# Patient Record
Sex: Female | Born: 1935 | Race: White | Hispanic: No | Marital: Married | Smoking: Never smoker
Health system: Southern US, Community
[De-identification: ages and names within clinical notes are randomized; demographics above are authoritative.]

## PROBLEM LIST (undated history)

## (undated) DIAGNOSIS — I10 Essential (primary) hypertension: Secondary | ICD-10-CM

## (undated) DIAGNOSIS — R55 Syncope and collapse: Secondary | ICD-10-CM

## (undated) DIAGNOSIS — M8580 Other specified disorders of bone density and structure, unspecified site: Secondary | ICD-10-CM

## (undated) DIAGNOSIS — E039 Hypothyroidism, unspecified: Secondary | ICD-10-CM

## (undated) DIAGNOSIS — J069 Acute upper respiratory infection, unspecified: Secondary | ICD-10-CM

## (undated) DIAGNOSIS — E7800 Pure hypercholesterolemia, unspecified: Secondary | ICD-10-CM

## (undated) DIAGNOSIS — M1990 Unspecified osteoarthritis, unspecified site: Secondary | ICD-10-CM

## (undated) DIAGNOSIS — Z973 Presence of spectacles and contact lenses: Secondary | ICD-10-CM

## (undated) HISTORY — PX: OTHER SURGICAL HISTORY: SHX169

## (undated) HISTORY — PX: TUBAL LIGATION: SHX77

## (undated) HISTORY — PX: TONSILLECTOMY: SUR1361

## (undated) HISTORY — PX: DILATION AND CURETTAGE OF UTERUS: SHX78

## (undated) HISTORY — PX: EYE SURGERY: SHX253

## (undated) HISTORY — PX: ABDOMINAL HYSTERECTOMY: SHX81

## (undated) HISTORY — DX: Syncope and collapse: R55

---

## 1986-03-21 HISTORY — PX: OTHER SURGICAL HISTORY: SHX169

## 1998-01-27 ENCOUNTER — Other Ambulatory Visit: Admission: RE | Admit: 1998-01-27 | Discharge: 1998-01-27 | Payer: Self-pay | Admitting: Cardiology

## 1999-02-10 ENCOUNTER — Encounter: Admission: RE | Admit: 1999-02-10 | Discharge: 1999-02-10 | Payer: Self-pay | Admitting: Cardiology

## 1999-02-10 ENCOUNTER — Encounter: Payer: Self-pay | Admitting: Cardiology

## 2000-02-14 ENCOUNTER — Encounter: Admission: RE | Admit: 2000-02-14 | Discharge: 2000-02-14 | Payer: Self-pay | Admitting: Internal Medicine

## 2000-02-14 ENCOUNTER — Encounter: Payer: Self-pay | Admitting: Internal Medicine

## 2000-04-13 ENCOUNTER — Other Ambulatory Visit: Admission: RE | Admit: 2000-04-13 | Discharge: 2000-04-13 | Payer: Self-pay | Admitting: Internal Medicine

## 2000-12-18 ENCOUNTER — Ambulatory Visit (HOSPITAL_COMMUNITY): Admission: RE | Admit: 2000-12-18 | Discharge: 2000-12-18 | Payer: Self-pay | Admitting: Gastroenterology

## 2000-12-18 ENCOUNTER — Encounter (INDEPENDENT_AMBULATORY_CARE_PROVIDER_SITE_OTHER): Payer: Self-pay | Admitting: *Deleted

## 2001-02-20 ENCOUNTER — Encounter: Admission: RE | Admit: 2001-02-20 | Discharge: 2001-02-20 | Payer: Self-pay | Admitting: *Deleted

## 2001-02-20 ENCOUNTER — Encounter: Payer: Self-pay | Admitting: *Deleted

## 2001-02-28 ENCOUNTER — Encounter: Payer: Self-pay | Admitting: *Deleted

## 2001-02-28 ENCOUNTER — Encounter: Admission: RE | Admit: 2001-02-28 | Discharge: 2001-02-28 | Payer: Self-pay | Admitting: *Deleted

## 2001-05-02 ENCOUNTER — Other Ambulatory Visit: Admission: RE | Admit: 2001-05-02 | Discharge: 2001-05-02 | Payer: Self-pay | Admitting: *Deleted

## 2002-03-04 ENCOUNTER — Encounter: Payer: Self-pay | Admitting: Obstetrics and Gynecology

## 2002-03-04 ENCOUNTER — Encounter: Admission: RE | Admit: 2002-03-04 | Discharge: 2002-03-04 | Payer: Self-pay | Admitting: Obstetrics and Gynecology

## 2002-06-05 ENCOUNTER — Other Ambulatory Visit: Admission: RE | Admit: 2002-06-05 | Discharge: 2002-06-05 | Payer: Self-pay | Admitting: Obstetrics & Gynecology

## 2003-03-31 ENCOUNTER — Encounter: Admission: RE | Admit: 2003-03-31 | Discharge: 2003-03-31 | Payer: Self-pay | Admitting: Internal Medicine

## 2003-10-29 ENCOUNTER — Emergency Department (HOSPITAL_COMMUNITY): Admission: EM | Admit: 2003-10-29 | Discharge: 2003-10-29 | Payer: Self-pay | Admitting: Emergency Medicine

## 2004-02-24 ENCOUNTER — Ambulatory Visit (HOSPITAL_COMMUNITY): Admission: RE | Admit: 2004-02-24 | Discharge: 2004-02-24 | Payer: Self-pay | Admitting: Gastroenterology

## 2004-04-22 ENCOUNTER — Encounter: Admission: RE | Admit: 2004-04-22 | Discharge: 2004-04-22 | Payer: Self-pay | Admitting: Internal Medicine

## 2005-05-09 ENCOUNTER — Encounter: Admission: RE | Admit: 2005-05-09 | Discharge: 2005-05-09 | Payer: Self-pay | Admitting: Internal Medicine

## 2006-03-02 ENCOUNTER — Encounter: Admission: RE | Admit: 2006-03-02 | Discharge: 2006-03-02 | Payer: Self-pay | Admitting: Family Medicine

## 2006-03-09 ENCOUNTER — Encounter: Admission: RE | Admit: 2006-03-09 | Discharge: 2006-03-09 | Payer: Self-pay | Admitting: Orthopedic Surgery

## 2006-03-21 HISTORY — PX: HERNIA REPAIR: SHX51

## 2006-04-03 ENCOUNTER — Encounter: Admission: RE | Admit: 2006-04-03 | Discharge: 2006-04-03 | Payer: Self-pay | Admitting: Orthopedic Surgery

## 2006-04-24 ENCOUNTER — Encounter: Admission: RE | Admit: 2006-04-24 | Discharge: 2006-04-24 | Payer: Self-pay | Admitting: Specialist

## 2006-05-08 ENCOUNTER — Encounter: Admission: RE | Admit: 2006-05-08 | Discharge: 2006-05-08 | Payer: Self-pay | Admitting: Orthopedic Surgery

## 2006-05-11 ENCOUNTER — Encounter: Admission: RE | Admit: 2006-05-11 | Discharge: 2006-05-11 | Payer: Self-pay | Admitting: Internal Medicine

## 2006-10-27 ENCOUNTER — Encounter: Admission: RE | Admit: 2006-10-27 | Discharge: 2006-10-27 | Payer: Self-pay | Admitting: Rheumatology

## 2007-02-26 ENCOUNTER — Ambulatory Visit (HOSPITAL_COMMUNITY): Admission: RE | Admit: 2007-02-26 | Discharge: 2007-02-26 | Payer: Self-pay | Admitting: Surgery

## 2007-03-22 HISTORY — PX: OTHER SURGICAL HISTORY: SHX169

## 2007-03-22 HISTORY — PX: BACK SURGERY: SHX140

## 2007-05-14 ENCOUNTER — Encounter: Admission: RE | Admit: 2007-05-14 | Discharge: 2007-05-14 | Payer: Self-pay | Admitting: Internal Medicine

## 2007-05-17 ENCOUNTER — Encounter: Admission: RE | Admit: 2007-05-17 | Discharge: 2007-05-17 | Payer: Self-pay | Admitting: Specialist

## 2007-06-14 ENCOUNTER — Observation Stay (HOSPITAL_COMMUNITY): Admission: RE | Admit: 2007-06-14 | Discharge: 2007-06-15 | Payer: Self-pay | Admitting: Neurological Surgery

## 2007-07-21 ENCOUNTER — Encounter: Admission: RE | Admit: 2007-07-21 | Discharge: 2007-07-21 | Payer: Self-pay | Admitting: Neurological Surgery

## 2007-08-08 ENCOUNTER — Encounter: Admission: RE | Admit: 2007-08-08 | Discharge: 2007-08-08 | Payer: Self-pay | Admitting: Orthopedic Surgery

## 2007-09-26 ENCOUNTER — Inpatient Hospital Stay (HOSPITAL_COMMUNITY): Admission: RE | Admit: 2007-09-26 | Discharge: 2007-09-28 | Payer: Self-pay | Admitting: Orthopedic Surgery

## 2008-02-09 ENCOUNTER — Encounter: Admission: RE | Admit: 2008-02-09 | Discharge: 2008-02-09 | Payer: Self-pay | Admitting: Orthopedic Surgery

## 2008-05-14 ENCOUNTER — Encounter: Admission: RE | Admit: 2008-05-14 | Discharge: 2008-05-14 | Payer: Self-pay | Admitting: Internal Medicine

## 2008-07-10 ENCOUNTER — Emergency Department (HOSPITAL_COMMUNITY): Admission: EM | Admit: 2008-07-10 | Discharge: 2008-07-10 | Payer: Self-pay | Admitting: Emergency Medicine

## 2009-06-08 ENCOUNTER — Encounter: Admission: RE | Admit: 2009-06-08 | Discharge: 2009-06-08 | Payer: Self-pay | Admitting: Internal Medicine

## 2010-05-24 ENCOUNTER — Other Ambulatory Visit: Payer: Self-pay | Admitting: Internal Medicine

## 2010-05-24 DIAGNOSIS — Z1231 Encounter for screening mammogram for malignant neoplasm of breast: Secondary | ICD-10-CM

## 2010-06-10 ENCOUNTER — Ambulatory Visit
Admission: RE | Admit: 2010-06-10 | Discharge: 2010-06-10 | Disposition: A | Discharge status: Home / Self Care | Payer: Medicare (Managed Care) | Source: Ambulatory Visit | Attending: Internal Medicine | Admitting: Internal Medicine

## 2010-06-10 DIAGNOSIS — Z1231 Encounter for screening mammogram for malignant neoplasm of breast: Secondary | ICD-10-CM

## 2010-08-03 NOTE — Op Note (Signed)
NAME:  Shelley Cook, Shelley Cook               ACCOUNT NO.:  [id number]   MEDICAL RECORD NO.:  [id number]          PATIENT TYPE:  INP   LOCATION:  0005                         FACILITY:  [facility]   PHYSICIAN:  Madlyn Frankel. Charlann Boxer, M.D.  DATE OF BIRTH:  [birth year]   DATE OF PROCEDURE:  09/26/2007  DATE OF DISCHARGE:                               OPERATIVE REPORT   PREOPERATIVE DIAGNOSIS:  Left hip avascular necrosis with a history of  previous percutaneous cannulated screw fixation for femoral neck  fracture.   POSTOPERATIVE DIAGNOSIS:  Left hip avascular necrosis with a history of  previous percutaneous cannulated screw fixation for femoral neck  fracture.   PROCEDURE:  Conversion of previous left hip surgery to a left total hip  replacement with removal of hardware.   SURGEON:  Madlyn Frankel. Charlann Boxer, M.D.   ASSISTANT:  Yetta Glassman. Mann, PA   COMPONENTS USED:  DePuy hip system 52 Pinnacle cup, 36 +4 marathon  liner, a 5 standard stem with a +5 ball.   ANESTHESIA:  General.   BLOOD LOSS:  250 mL.   DRAINS:  Times one.   COMPLICATIONS:  None.   INDICATION OF THE PROCEDURE:  Shelley Cook is a 77 year old female who  presented on evaluation of persistent left hip pain.  She had had  previous problems with her back including decompressive surgery without  significant relief.  Radiographs and a workup of persistent discomfort  revealed avascular changes.  She was sent for consultation.  I reviewed  the findings.  She had evidence of advanced collapse with previous  hardware placed.  Given these findings we reviewed her options.  She had  had a cortisone injection was confirmed relief of her pain but it was  nonsustained.  She wished to proceed with surgery.  The risks and  benefits of surgery were discussed.  Consent was obtained including the  review of risks of infection, DVT, component failure, dislocation, need  for revision surgery.   PROCEDURE IN DETAIL:  The patient was brought to the  operative theater.  Once adequate anesthesia preoperative antibiotics, clindamycin due to  penicillin allergy administered the patient was positioned in the right  lateral decubitus position with the identified left hip up.  Left lower  extremity was prescrubbed and prepped and draped in sterile fashion.  The patient's old incision was a very long incision more anterior than  what I would need to utilize I thus created a single incision  posteriorly incorporating the lateral aspect of the femur so could get  the hardware out.  Attention was first directed at the hardware removal.  I incised the iliotibial band and was able to palpate the screws.  Using  the hand screwdriver I was able to remove the screws part way then used  power screwdrivers.  There were removed without difficulty or  complications.  Following this removal I irrigated this lateral wound  and reapproximated the wound edges with a #1 Vicryl.   At this point we attended to the hip.  The iliotibial band and gluteal  fascia was then incised posteriorly.  With the  hip internally rotated  the short external rotators were identified and taken down to the  posterior capsule.  A capsulotomy was created, hip was dislocated.  A  neck osteotomy was made at this point based off anatomic landmarks  utilizing a trial neck and head size.  The patient's head was noted to  have severe avascular changes with obvious collapse and fragmentation of  the cartilage surfaces with subchondral collapse and big fragment.   Attention was first directed to the femur.  I used the box osteotome to  assure laterality.  I then started an initiating reamer and then a hand  reamer.  I then irrigated the canal to prevent fat emboli.  We began  broaching with size 1.  I carried it up to a size 5 which sat at the  level of my neck cut medially.  At this point I packed the femur and  acetabulum.  Femoral anteversion was set at 20-25 degrees.  Once I had   placed retractors around the acetabulum the labrum was removed.  I began  reaming with a 44 reamer up to a 51 reamer with good bony bed  preparation down the medial wall with good coverage.  I thus impacted 52  Pinnacle cup set anatomically within the ischium of the anterior wall  and with some superior lateral cup exposed indicating good anteversion.  I checked with the hip guide and it was about 35-40 degrees of  abduction, 20 degrees of forward flexion.  I placed a single cancellous  screw due to the initial fit with good purchase.  I then placed the  final 36 +4 Marathon liner choosing this liner from Marathon due to her  age and ball size increasing instability.   We attended to now the femur.  We placed the 5 broach and the standard  neck.  I used a first a +1.5 and then a 5 ball.  I did not think this  effected the leg lengths.  It helped a little bit with tension on the  abductors.  The hip range of motion was excellent with no evidence of  any subluxation or impingement.  I removed the trial broach and placed a  size 5 standard stem.  This was impacted at the level where the broach  was and then a 36 +5 ball was impacted onto the dry trunnion.  We  irrigated the hip throughout the case and again at this point.  I  reapproximated the posterior capsule with superior leaflet using #1  Ethibond.  I placed a medium Hemovac drain deep.  I then reapproximated  the iliotibial band and gluteal fascia with #1 running Vicryl.  I did  release some of the tensor fascia of the iliotibial band anteriorly to  relax some of the tension on lateral structures.  This was due to some  scarring experienced from removal of the hardware.  Following this the  remainder of the wound was closed with 2-0 Vicryl and a running 4-0  Monocryl.  The hip was cleaned, dried and dressed sterilely with Steri-  Strips and a sterile Mepilex dressing.  She was then brought to the  recovery room and extubated in stable  condition tolerating the procedure  well.      Madlyn Frankel Charlann Boxer, M.D.  Electronically Signed     MDO/MEDQ  D:  09/26/2007  T:  09/26/2007  Job:  [id number]   cc:   Tia Alert, MD  Fax: [phone]

## 2010-08-03 NOTE — Op Note (Signed)
NAME:  Shelley Cook, Shelley Cook               ACCOUNT NO.:  [id number]   MEDICAL RECORD NO.:  [id number]          PATIENT TYPE:  OBV   LOCATION:  3536                         FACILITY:  [facility]   PHYSICIAN:  Tia Alert, MD     DATE OF BIRTH:  Jun 15, 1929   DATE OF PROCEDURE:  06/14/2007  DATE OF DISCHARGE:                               OPERATIVE REPORT   PREOPERATIVE DIAGNOSIS:  Lumbar spinal stenosis of L4-5, L5-S1 on the  left with lumbar disk herniation of L4-5 on the left with left groin and  leg pain.   POSTOPERATIVE DIAGNOSIS:  Lumbar spinal stenosis of L4-5, L5-S1 on the  left with lumbar disk herniation of L4-5 on the left with left groin and  leg pain.   PROCEDURE:  1. Decompressive lumbar hemilaminectomy, medial facetectomy,      foraminotomy of L4-5 on the left for L5 nerve root decompression      followed by microdiskectomy of L4-5 on the left utilizing      microscopic dissection.  2. Lumbar hemilaminectomy, medial facetectomy, foraminotomy of L5-S1      on the left for decompression of left S1 nerve root utilizing      microscopic dissection.   SURGEON:  Tia Alert, M.D.   ANESTHESIA:  General endotracheal.   COMPLICATIONS:  None apparent.   INDICATIONS FOR PROCEDURE:  Shelley Cook is a 77 year old female who was  referred with left leg pain in a strict L5 distribution, but she also  had left groin pain.  She had MRI which showed multilevel  spondylosis which showed significant lateral recess stenosis at L4-5 on  the left with the disk herniation at L4-5 on the left with also lateral  recess stenosis at L5-S1 on the left side.  I recommended a two-level  decompressive hemilaminectomy, medial facetectomy, and foraminotomy with  possible diskectomy at L4-5.  She understood the risks, benefits, and  expected outcome and wished to proceed.   DESCRIPTION OF PROCEDURE:  The patient was taken to the operating room,  and after induction of adequate general endotracheal  anesthesia, she was  rolled into the prone position on the Wilson frame and all pressure  points were padded.  The lumbar region was prepped with DuraPrep and  then draped in the usual sterile fashion.   5 mL of local anesthesia was injected, and a small dorsal midline  incision was made and carried down to the lumbosacral fascia.  The  fascia was opened, and the paraspinous musculature was taken down in a  subperiosteal fashion to expose L4-5 and L5-S1 on the left.  Intraoperative x-ray confirmed my level, and then I used the Kerrison  punches to perform hemilaminectomies, medial facetectomies, and  foraminotomies at L4-5 and L5-S1 on the left side.  The lateral recess  was decompressed by undercutting the facets.  The yellow ligament was  removed.  We got well lateral to the dura.  I marched along the pedicle  at L5 along the L5 nerve root to assure adequate decompression of the L5  nerve root.  I then palpated with a coronary  dilator.  The nerve root  was free.  I felt no compression there.  I decompressed the left S1  nerve root by marching along the pedicle and decompressing the lateral  recess and palpated there with a coronary dilator.  I had excellent  decompression of the thecal sac and the L5 the S1 nerve roots.  I  retracted the nerve root medially at L5.  I found a calcified disk  herniation that was removed with an osteophyte remover.  I then  irrigated with saline solution containing bacitracin, dried all bleeding  points, inspected the nerve roots once again, lined the dura with  Gelfoam, and then closed the muscle with 0 Vicryl, closing the  subcutaneous and subcuticular tissue with 2-0 and 3-0 Vicryl, and closed  the skin with Benzoin and Steri-Strips.  The drapes were removed.  A  sterile dressing was applied.   The patient was awakened from general anesthesia and transferred to the  recovery room in stable condition.  At the end of the procedure, all  sponge, needle,  and instrument counts were correct.      Tia Alert, MD  Electronically Signed     DSJ/MEDQ  D:  06/14/2007  T:  06/15/2007  Job:  [id number]

## 2010-08-03 NOTE — Op Note (Signed)
NAME:  Shelley Cook, Shelley Cook               ACCOUNT NO.:  [id number]   MEDICAL RECORD NO.:  [id number]          PATIENT TYPE:  AMB   LOCATION:  DAY                          FACILITY:  [facility]   PHYSICIAN:  Wilmon Arms. Corliss Skains, M.D. DATE OF BIRTH:  [birth year]   DATE OF PROCEDURE:  02/26/2007  DATE OF DISCHARGE:                               OPERATIVE REPORT   PREOPERATIVE DIAGNOSIS:  Left inguinal hernia.   POSTOPERATIVE DIAGNOSIS:  Left inguinal hernia.   PROCEDURE PERFORMED:  Left inguinal hernia repair with mesh.   SURGEON:  Wilmon Arms. Corliss Skains, M.D., FACS   ANESTHESIA:  General.   INDICATIONS:  The patient is a 77 year old female who presents with  several months of an enlarging bulge in her left groin.  This has caused  some discomfort.  She was evaluated and was felt to have a left inguinal  hernia.  We recommended elective repair.   DESCRIPTION OF PROCEDURE:  The patient is brought to the operating room,  placed in the supine position on the operating room table.  After an  adequate level of general anesthesia was obtained, the patient's left  groin was shaved, prepped with Betadine and draped in sterile fashion.  A time-out was taken to assure the proper patient and proper procedure.  The area above her left inguinal ligament was infiltrated with 0.25%  Marcaine.  We made an oblique incision above the inguinal ligament.  Dissection was carried down through the subcutaneous tissues to the  external oblique fascia.  The Weitlaner retractor was inserted.  We  opened the external oblique fascia along the direction of its fibers.  We were able to dissect around the round ligament. There was a moderate-  sized indirect hernia sac adherent to the round ligament.  The round  ligament was divided between clamps distally.  It was then ligated with  #0 silk ties. The round ligament and hernia sac were then reduced up  through the internal ring. The internal ring was then closed with a  running 2-0  Vicryl suture. 2-0 Vicryl was then used to secure an oval  shaped piece of Ultrapro mesh beginning at the pubic tubercle.  It was  attached to the shelving edge inferiorly and the internal oblique  superiorly. The lateral end was tucked underneath the external oblique  fascia.  The external oblique fascia was reapproximated with 2-0 Vicryl.  3-0 Vicryl was used to close the subcutaneous tissues and 4-0 Monocryl  was used to close the skin.  Steri-Strips and clean dressings were  applied.  The patient was extubated and brought to the recovery room in  stable condition.      Wilmon Arms. Tsuei, M.D.  Electronically Signed     MKT/MEDQ  D:  02/26/2007  T:  02/26/2007  Job:  [id number]

## 2010-08-06 NOTE — H&P (Signed)
NAME:  Shelley Cook, Shelley Cook               ACCOUNT NO.:  [id number]   MEDICAL RECORD NO.:  [id number]         PATIENT TYPE:  LINP   LOCATION:                               FACILITY:  [facility]   PHYSICIAN:  Madlyn Frankel. Charlann Boxer, M.D.  DATE OF BIRTH:  Oct 15, 1929   DATE OF ADMISSION:  09/26/2007  DATE OF DISCHARGE:                              HISTORY & PHYSICAL   Procedure will be a conversion left total hip replacement.   CHIEF COMPLAINT:  Left hip pain.   HISTORY OF PRESENT ILLNESS:  A 77 year old female with a history of left  hip pain secondary to avascular necrosis with a significant history of  recent lumbar surgery due to disk herniation.  Her left hip has been  refractory to all conservative treatment, also including intra-articular  injection.  She has been presurgically assessed by Dr. Felipa Eth for a left  total knee replacement.   PAST MEDICAL HISTORY:  1. Osteoarthritis.  2. Degenerative disk disease with disk herniation.  3. Hypertension.  4. Dyslipidemia.  5. Hypothyroidism.   PAST SURGICAL HISTORY:  1. Hysterectomy.  2. Heel surgery.  3. Hernia repair.  4. Lumbar diskectomy.   FAMILY HISTORY:  Heart disease, Alzheimer's, lung cancer.   SOCIAL HISTORY:  Married.  Primary caregiver will be her husband at home  after surgery.   ALLERGIES:  PENICILLIN, CEFTIN, AUGMENTIN, KEFLEX, ZITHROMAX, SUDAFED,  SULFA, LEVAQUIN, NITROFURANTOIN, CELEBREX, IBUPROFEN ALEVE.  States  aspirin; however, the patient takes Ecotrin with no complications.   MEDICATIONS:  1. Synthroid 88 mcg one daily.  2. Coreg 25 mg p.o. b.i.d.  3. Clonidine 0.1 mg p.o. daily.  4. Azor 5/40 mg one p.o. daily.  5. Zocor 20 mg p.o. daily.  6. Ecotrin 81 mg one p.o. daily.  7. Singulair 10 mg one p.o. daily.  8. Allegra 180 mg p.o. p.r.n. only.  9. Boniva once monthly on the 16th month 150 mg p.o.   REVIEW OF SYSTEMS:  None other than HPI.   PHYSICAL EXAMINATION:  Pulse 64, respirations 18, blood pressure  124/76.  GENERAL:  Awake, alert and oriented, well-developed, well-nourished, in  no acute distress.  NECK:  Supple.  No carotid bruits.  CHEST:  Lungs are clear to auscultation bilaterally.  HEART:  Regular rate and rhythm.  S1, S2 distinct.  ABDOMEN:  Soft, nontender, bowel sounds present.  GENITOURINARY:  Deferred.  EXTREMITIES:  The left hip has decreased range of motion with increased  pain with range of motion.  SKIN:  No cellulitis.  NEUROLOGIC:  Intact distal sensibilities.   Labs, EKG, chest x-ray:  All pending presurgical testing.   IMPRESSION:  Left hip avascular necrosis, status post previous  percutaneous screw fixation.   PLAN OF ACTION:  Conversion left total hip arthroplasty by surgeon Dr.  Durene Romans, September 26, 2007, at [facility].  The risks and  complications were discussed as well as postoperative rehab.   Postoperative medications were provided at time of history and physical  including Lovenox, Robaxin, iron, aspirin, Colace, MiraLax.  Pain  medicines will be provided at time of surgery.  ______________________________  Yetta Glassman [address], [place]      Madlyn Frankel. Charlann Boxer, M.D.  Electronically Signed    BLM/MEDQ  D:  09/10/2007  T:  09/10/2007  Job:  [id number]   cc:   Larina Earthly, M.D.  Fax: [phone]   Tia Alert, MD  Fax: [phone]

## 2010-08-06 NOTE — Discharge Summary (Signed)
NAME:  Shelley Cook, Shelley Cook               ACCOUNT NO.:  [id number]   MEDICAL RECORD NO.:  [id number]          PATIENT TYPE:  INP   LOCATION:  1604                         FACILITY:  [facility]   PHYSICIAN:  Madlyn Frankel. Charlann Boxer, M.D.  DATE OF BIRTH:  13-Oct-1929   DATE OF ADMISSION:  09/26/2007  DATE OF DISCHARGE:  09/28/2007                               DISCHARGE SUMMARY   ADMISSION DIAGNOSES:  1. Avascular necrosis.  2. Osteoarthritis.  3. Degenerative disk disease.  4. Hypertension.  5. Dyslipidemia.  6. Hypothyroidism.   DISCHARGE DIAGNOSES:  1. Avascular necrosis.  2. Osteoarthritis.  3. Degenerative disk disease.  4. Hypertension.  5. Dyslipidemia.  6. Hypothyroidism.   HISTORY OF PRESENT ILLNESS:  A 77 year old female with a history of left  hip pain secondary to avascular necrosis with significant history of  recent lumbar surgery due to disk herniation.  Refractory to all  conservative treatment.   CONSULTATION:  None.   PROCEDURE:  Left total hip arthroplasty by surgeon, Dr. Durene Romans.  Assistant, Dwyane Luo PA-C.   LABORATORY DATA:  CBC on admission hemoglobin 13.9, hematocrit 40.7,  platelets at 275; at time of discharge 8.8, 25.3 and platelets 149.  White cell differential no significant abnormalities.  Coagulation all  within normal limits.  Routine chemistry on admission with  sodium 135,  potassium 5.2, glucose 105 and creatinine 0.76; at time of discharge,  sodium 129, potassium 3.9, glucose 138, creatinine 0.55.  Kidney  function normal with calcium 8.2 at discharge.  UA showed hyaline casts,  small leukocyte esterase and negative nitrites.   No chest x-ray found on chart.   Previous EKG showed sinus bradycardia, borderline ECG.   HOSPITAL COURSE:  The patient underwent left total hip placement,  admitted to the orthopedic floor.  She made adequate progress during the  course of stay.  She remained hemodynamically stable, afebrile.  Dressing was changed on a  daily basis with no significant drainage from  the wound.  She remained neurovascularly intact with this left lower  extremity.  DVT prophylaxis started on day #1.  Not eventful with  ability to walk at least 20 feet with rolling walker prior to discharge  and all functional criteria met.   DISCHARGE DISPOSITION:  Discharged home with home health care PT.   DISCHARGE PHYSICAL THERAPY:  Weightbearing as tolerated with use of a  rolling walker.   DISCHARGE DIET:  Regular.   DISCHARGE WOUND CARE:  Keep dry.   DISCHARGE MEDICATIONS:  1. Lovenox 40 mg subcu q.24 h x11 days.  2. Robaxin 500 mg p.o. q.6 h.  3. Iron 325 mg one p.o. t.i.d. x2 weeks.  4. Aspirin 325 mg one p.o. daily after Lovenox completed x4 weeks.  5. Colace 100 mg p.o. b.i.d.  6. MiraLax 17 grams p.o. daily.  7. Vicodin 5/325 one to two p.o. q.4-6 h p.r.n. pain.  8. Ambien 5 mg one p.o. nightly.  9. Synthroid 88 mcg p.o. daily.  10.Coreg 25 mg one p.o. b.i.d.  11.Clonidine 0.1 mg one p.o. daily.  12.Azor 5/40 one p.o. nightly.  13.Zocor 20  mg one p.o. nightly.  14.Ecotrin.  15.Singulair 10 mg p.o. nightly.  16.Allegra 180 mg p.r.n.  17.Boniva 150 mg monthly on the 16th.   DISCHARGE FOLLOWUP:  Follow up with Dr. Charlann Boxer, phone number [phone] in 2  weeks.     ______________________________  Yetta Glassman [address], [place]      Madlyn Frankel. Charlann Boxer, M.D.  Electronically Signed    BLM/MEDQ  D:  11/05/2007  T:  11/05/2007  Job:  [id number]   cc:   Larina Earthly, M.D.  Fax: [phone]   Marikay Alar  [address]  [place]  [place] [address]

## 2010-08-06 NOTE — Procedures (Signed)
[place]  Patient:    EZMA, REHM Visit Number: [id number] MRN: [id number]          Service Type: END Location: ENDO Attending Physician:  Rich Brave Dictated by:   Florencia Reasons, M.D. Proc. Date: 12/18/00 Admit Date:  12/18/2000   CC:         Ravi R. Felipa Eth, M.D.   Procedure Report  PROCEDURE PERFORMED:  Colonoscopy with polypectomy.  ENDOSCOPIST:  Florencia Reasons, M.D.  INDICATIONS FOR PROCEDURE:  The patient is a 77 year old female who underwent a screening sigmoidoscopic evaluation in Dr. Lanier Clam office recently by Dr. Dossie Arbour, which disclosed a small fleshy polyp a 15 cm.  FINDINGS:  Polyp removed as described.  No additional lesions identified.  DESCRIPTION OF PROCEDURE:  The nature, purpose and risks of the procedure had been discussed with the patient, who provided written consent.  Sedation was fentanyl 75 mcg and Versed 7 mg IV without arrhythmias or desaturation.  The Olympus adjustable tension pediatric video colonoscope was advanced to the cecum with moderate difficulty due to looping.  The cecum was identified by visualization of the ileocecal valve, the absence of further lumen and very The quality of the prep was very good and it is felt that all areas were adequately seen.  It should be noted that in order to reach the cecum in this patient, to control looping we had to turn the patient into the supine and then right lateral decubitus positions and then back onto her back, but ultimately we were able to reach the cecum as described above.  The only abnormality encountered on this exam was the presence of a  5 mm fleshy polyp removed by snare technique at about 15 cm from the external inguinal opening.  The polyp was retrieved by suctioning through the scope. There was complete hemostasis and no evidence of excessive cautery. Retroflexion was unremarkable.  No large polyps, cancer, colitis,  vascular malformations were noted.  The patient tolerated the procedure well and there were no apparent complications.   IMPRESSION:  Rectal polyp removed by snare technique.  Otherwise normal exam to the cecum.  PLAN:  Await pathology on the polyp with further management to depend on histologic findings. y Dictated by:   Florencia Reasons, M.D. Attending Physician:  Rich Brave DD:  12/18/00 TD:  12/18/00 Job: [id number]

## 2010-08-06 NOTE — Op Note (Signed)
NAME:  Shelley Cook, Shelley Cook               ACCOUNT NO.:  [id number]   MEDICAL RECORD NO.:  [id number]          PATIENT TYPE:  AMB   LOCATION:  ENDO                         FACILITY:  [facility]   PHYSICIAN:  Bernette Redbird, M.D.   DATE OF BIRTH:  02/29/1930   DATE OF PROCEDURE:  02/24/2004  DATE OF DISCHARGE:                                 OPERATIVE REPORT   PROCEDURE:  Colonoscopy.   INDICATIONS FOR PROCEDURE:  Follow up of several colonic adenomas removed  several years ago.   FINDINGS:  Normal exam to the cecum.   PROCEDURE:  The nature, purpose, and risks of the procedure were familiar to  the patient who provided written consent.  Sedation was Fentanyl 60 mcg and  Versed 7.5 mg IV without arrhythmias or desaturations.  The Olympus adult  video colonoscope was used for this procedure and was much more easy to  advance than the pediatric adjustable tension scope that had been used  previously.  In fact, the adult scope was able to be advanced without any  external abdominal compression and we reached the cecum quite easily, taking  out loops as I went.  The cecum was identified by clear visualization of the  appendiceal orifice.  Pull back was then performed.  The quality of the prep  was excellent and it was felt that all areas were well seen.  This was a  normal examination.  No polyps, cancer, colitis, vascular malformations, or  diverticulosis were noted.  Retroflexion in the rectum and reinspection in  the rectum were unremarkable.  No biopsies were obtained.  The patient  tolerated the procedure well and there were no apparent complications.   IMPRESSION:  Prior history of colonic adenoma without worrisome findings on  current examination.   PLAN:  Follow up colonoscopy in five years.       RB/MEDQ  D:  02/24/2004  T:  02/24/2004  Job:  [id number]   cc:   Larina Earthly, M.D.  [address]  [place]  [place] [address]  Fax: [phone]

## 2010-12-13 LAB — DIFFERENTIAL
Basophils Absolute: 0
Eosinophils Relative: 4
Lymphocytes Relative: 10 — ABNORMAL LOW
Neutro Abs: 6.8
Neutrophils Relative %: 77

## 2010-12-13 LAB — BASIC METABOLIC PANEL
BUN: 10
Chloride: 93 — ABNORMAL LOW
GFR calc non Af Amer: >60
Potassium: 4.5
Sodium: 128 — ABNORMAL LOW

## 2010-12-13 LAB — PROTIME-INR
INR: 1.1
Prothrombin Time: 14.3

## 2010-12-13 LAB — CBC
HCT: 40.5
Hemoglobin: 13.9
MCV: 95.2
RBC: 4.26
WBC: 8.8

## 2010-12-16 LAB — URINALYSIS, ROUTINE W REFLEX MICROSCOPIC
Glucose, UA: NEGATIVE
Hgb urine dipstick: NEGATIVE
Nitrite: NEGATIVE
Protein, ur: NEGATIVE
Specific Gravity, Urine: 1.026
Urobilinogen, UA: 0.2
pH: 6.5

## 2010-12-16 LAB — URINE MICROSCOPIC-ADD ON

## 2010-12-16 LAB — BASIC METABOLIC PANEL
BUN: 12
BUN: 5 — ABNORMAL LOW
BUN: 5 — ABNORMAL LOW
CO2: 27
Calcium: 9.6
Chloride: 100
Chloride: 97
Creatinine, Ser: 0.76
GFR calc Af Amer: >60
GFR calc non Af Amer: >60
GFR calc non Af Amer: >60
Glucose, Bld: 105 — ABNORMAL HIGH
Potassium: 3.9
Potassium: 4.4
Potassium: 5.2 — ABNORMAL HIGH
Sodium: 132 — ABNORMAL LOW
Sodium: 135

## 2010-12-16 LAB — CBC
HCT: 25.3 — ABNORMAL LOW
HCT: 31.6 — ABNORMAL LOW
Hemoglobin: 10.7 — ABNORMAL LOW
Hemoglobin: 13.9
Hemoglobin: 8.8 — ABNORMAL LOW
MCHC: 34.2
MCV: 94.5
MCV: 94.9
Platelets: 149 — ABNORMAL LOW
Platelets: 194
RBC: 4.29
WBC: 8
WBC: 9.4

## 2010-12-16 LAB — TYPE AND SCREEN: Antibody Screen: NEGATIVE

## 2010-12-16 LAB — DIFFERENTIAL
Basophils Relative: 0
Eosinophils Absolute: 0.1
Eosinophils Relative: 1
Monocytes Absolute: 0.8
Monocytes Relative: 8

## 2010-12-27 LAB — BASIC METABOLIC PANEL
BUN: 10
CO2: 28
Chloride: 95 — ABNORMAL LOW
Creatinine, Ser: 0.69
Glucose, Bld: 69 — ABNORMAL LOW

## 2010-12-27 LAB — DIFFERENTIAL
Basophils Relative: 1
Eosinophils Absolute: 0.1 — ABNORMAL LOW
Neutrophils Relative %: 65

## 2010-12-27 LAB — CBC
MCHC: 34.7
MCV: 93.9
Platelets: 226
RDW: 13.3

## 2011-04-05 DIAGNOSIS — E785 Hyperlipidemia, unspecified: Secondary | ICD-10-CM | POA: Diagnosis not present

## 2011-04-05 DIAGNOSIS — I10 Essential (primary) hypertension: Secondary | ICD-10-CM | POA: Diagnosis not present

## 2011-04-05 DIAGNOSIS — R82998 Other abnormal findings in urine: Secondary | ICD-10-CM | POA: Diagnosis not present

## 2011-04-05 DIAGNOSIS — E039 Hypothyroidism, unspecified: Secondary | ICD-10-CM | POA: Diagnosis not present

## 2011-04-05 DIAGNOSIS — M810 Age-related osteoporosis without current pathological fracture: Secondary | ICD-10-CM | POA: Diagnosis not present

## 2011-04-12 DIAGNOSIS — IMO0002 Reserved for concepts with insufficient information to code with codable children: Secondary | ICD-10-CM | POA: Diagnosis not present

## 2011-04-12 DIAGNOSIS — J301 Allergic rhinitis due to pollen: Secondary | ICD-10-CM | POA: Diagnosis not present

## 2011-04-12 DIAGNOSIS — Z0000 Encounter for general adult medical examination without abnormal findings: Secondary | ICD-10-CM | POA: Diagnosis not present

## 2011-04-12 DIAGNOSIS — K5900 Constipation, unspecified: Secondary | ICD-10-CM | POA: Diagnosis not present

## 2011-04-12 DIAGNOSIS — Z1212 Encounter for screening for malignant neoplasm of rectum: Secondary | ICD-10-CM | POA: Diagnosis not present

## 2011-04-15 DIAGNOSIS — L723 Sebaceous cyst: Secondary | ICD-10-CM | POA: Diagnosis not present

## 2011-04-15 DIAGNOSIS — L819 Disorder of pigmentation, unspecified: Secondary | ICD-10-CM | POA: Diagnosis not present

## 2011-04-15 DIAGNOSIS — L821 Other seborrheic keratosis: Secondary | ICD-10-CM | POA: Diagnosis not present

## 2011-05-03 DIAGNOSIS — M899 Disorder of bone, unspecified: Secondary | ICD-10-CM | POA: Diagnosis not present

## 2011-05-03 DIAGNOSIS — M949 Disorder of cartilage, unspecified: Secondary | ICD-10-CM | POA: Diagnosis not present

## 2011-05-23 ENCOUNTER — Other Ambulatory Visit: Payer: Self-pay | Admitting: Internal Medicine

## 2011-05-23 DIAGNOSIS — Z1231 Encounter for screening mammogram for malignant neoplasm of breast: Secondary | ICD-10-CM

## 2011-05-31 DIAGNOSIS — H60399 Other infective otitis externa, unspecified ear: Secondary | ICD-10-CM | POA: Diagnosis not present

## 2011-05-31 DIAGNOSIS — H6120 Impacted cerumen, unspecified ear: Secondary | ICD-10-CM | POA: Diagnosis not present

## 2011-06-13 ENCOUNTER — Ambulatory Visit: Payer: Medicare (Managed Care)

## 2011-06-15 DIAGNOSIS — M1710 Unilateral primary osteoarthritis, unspecified knee: Secondary | ICD-10-CM | POA: Diagnosis not present

## 2011-06-16 ENCOUNTER — Ambulatory Visit
Admission: RE | Admit: 2011-06-16 | Discharge: 2011-06-16 | Disposition: A | Discharge status: Home / Self Care | Payer: Medicare Other | Source: Ambulatory Visit | Attending: Internal Medicine | Admitting: Internal Medicine

## 2011-06-16 DIAGNOSIS — Z1231 Encounter for screening mammogram for malignant neoplasm of breast: Secondary | ICD-10-CM

## 2011-06-22 DIAGNOSIS — M1710 Unilateral primary osteoarthritis, unspecified knee: Secondary | ICD-10-CM | POA: Diagnosis not present

## 2011-06-29 DIAGNOSIS — M1710 Unilateral primary osteoarthritis, unspecified knee: Secondary | ICD-10-CM | POA: Diagnosis not present

## 2011-07-06 DIAGNOSIS — M1710 Unilateral primary osteoarthritis, unspecified knee: Secondary | ICD-10-CM | POA: Diagnosis not present

## 2011-07-13 DIAGNOSIS — M1710 Unilateral primary osteoarthritis, unspecified knee: Secondary | ICD-10-CM | POA: Diagnosis not present

## 2011-08-10 DIAGNOSIS — H60399 Other infective otitis externa, unspecified ear: Secondary | ICD-10-CM | POA: Diagnosis not present

## 2011-08-10 DIAGNOSIS — H6120 Impacted cerumen, unspecified ear: Secondary | ICD-10-CM | POA: Diagnosis not present

## 2011-08-16 DIAGNOSIS — H2510 Age-related nuclear cataract, unspecified eye: Secondary | ICD-10-CM | POA: Diagnosis not present

## 2011-08-16 DIAGNOSIS — H52209 Unspecified astigmatism, unspecified eye: Secondary | ICD-10-CM | POA: Diagnosis not present

## 2011-08-16 DIAGNOSIS — H04129 Dry eye syndrome of unspecified lacrimal gland: Secondary | ICD-10-CM | POA: Diagnosis not present

## 2011-08-16 DIAGNOSIS — H43819 Vitreous degeneration, unspecified eye: Secondary | ICD-10-CM | POA: Diagnosis not present

## 2011-08-19 DIAGNOSIS — Z124 Encounter for screening for malignant neoplasm of cervix: Secondary | ICD-10-CM | POA: Diagnosis not present

## 2011-08-19 DIAGNOSIS — N952 Postmenopausal atrophic vaginitis: Secondary | ICD-10-CM | POA: Diagnosis not present

## 2011-08-19 DIAGNOSIS — N899 Noninflammatory disorder of vagina, unspecified: Secondary | ICD-10-CM | POA: Diagnosis not present

## 2011-08-31 DIAGNOSIS — H2589 Other age-related cataract: Secondary | ICD-10-CM | POA: Diagnosis not present

## 2011-08-31 DIAGNOSIS — H25019 Cortical age-related cataract, unspecified eye: Secondary | ICD-10-CM | POA: Diagnosis not present

## 2011-08-31 DIAGNOSIS — H2510 Age-related nuclear cataract, unspecified eye: Secondary | ICD-10-CM | POA: Diagnosis not present

## 2011-08-31 DIAGNOSIS — IMO0002 Reserved for concepts with insufficient information to code with codable children: Secondary | ICD-10-CM | POA: Diagnosis not present

## 2011-10-03 DIAGNOSIS — H60399 Other infective otitis externa, unspecified ear: Secondary | ICD-10-CM | POA: Diagnosis not present

## 2011-10-05 DIAGNOSIS — H2589 Other age-related cataract: Secondary | ICD-10-CM | POA: Diagnosis not present

## 2011-10-05 DIAGNOSIS — IMO0002 Reserved for concepts with insufficient information to code with codable children: Secondary | ICD-10-CM | POA: Diagnosis not present

## 2011-10-05 DIAGNOSIS — H2510 Age-related nuclear cataract, unspecified eye: Secondary | ICD-10-CM | POA: Diagnosis not present

## 2011-10-05 DIAGNOSIS — H25019 Cortical age-related cataract, unspecified eye: Secondary | ICD-10-CM | POA: Diagnosis not present

## 2011-12-20 DIAGNOSIS — J0100 Acute maxillary sinusitis, unspecified: Secondary | ICD-10-CM | POA: Diagnosis not present

## 2011-12-27 DIAGNOSIS — J0100 Acute maxillary sinusitis, unspecified: Secondary | ICD-10-CM | POA: Diagnosis not present

## 2012-01-16 DIAGNOSIS — H6520 Chronic serous otitis media, unspecified ear: Secondary | ICD-10-CM | POA: Diagnosis not present

## 2012-01-17 DIAGNOSIS — Z23 Encounter for immunization: Secondary | ICD-10-CM | POA: Diagnosis not present

## 2012-04-11 DIAGNOSIS — E039 Hypothyroidism, unspecified: Secondary | ICD-10-CM | POA: Diagnosis not present

## 2012-04-11 DIAGNOSIS — M810 Age-related osteoporosis without current pathological fracture: Secondary | ICD-10-CM | POA: Diagnosis not present

## 2012-04-11 DIAGNOSIS — I10 Essential (primary) hypertension: Secondary | ICD-10-CM | POA: Diagnosis not present

## 2012-04-11 DIAGNOSIS — E785 Hyperlipidemia, unspecified: Secondary | ICD-10-CM | POA: Diagnosis not present

## 2012-04-23 DIAGNOSIS — Z1212 Encounter for screening for malignant neoplasm of rectum: Secondary | ICD-10-CM | POA: Diagnosis not present

## 2012-05-08 DIAGNOSIS — H6120 Impacted cerumen, unspecified ear: Secondary | ICD-10-CM | POA: Diagnosis not present

## 2012-05-28 ENCOUNTER — Other Ambulatory Visit: Payer: Self-pay

## 2012-05-28 DIAGNOSIS — Z1231 Encounter for screening mammogram for malignant neoplasm of breast: Secondary | ICD-10-CM

## 2012-06-04 DIAGNOSIS — D1739 Benign lipomatous neoplasm of skin and subcutaneous tissue of other sites: Secondary | ICD-10-CM | POA: Diagnosis not present

## 2012-06-04 DIAGNOSIS — E785 Hyperlipidemia, unspecified: Secondary | ICD-10-CM | POA: Diagnosis not present

## 2012-06-04 DIAGNOSIS — IMO0002 Reserved for concepts with insufficient information to code with codable children: Secondary | ICD-10-CM | POA: Diagnosis not present

## 2012-06-04 DIAGNOSIS — K5900 Constipation, unspecified: Secondary | ICD-10-CM | POA: Diagnosis not present

## 2012-06-04 DIAGNOSIS — Z0000 Encounter for general adult medical examination without abnormal findings: Secondary | ICD-10-CM | POA: Diagnosis not present

## 2012-06-04 DIAGNOSIS — J301 Allergic rhinitis due to pollen: Secondary | ICD-10-CM | POA: Diagnosis not present

## 2012-06-04 DIAGNOSIS — I10 Essential (primary) hypertension: Secondary | ICD-10-CM | POA: Diagnosis not present

## 2012-06-04 DIAGNOSIS — L723 Sebaceous cyst: Secondary | ICD-10-CM | POA: Diagnosis not present

## 2012-06-27 DIAGNOSIS — M810 Age-related osteoporosis without current pathological fracture: Secondary | ICD-10-CM | POA: Diagnosis not present

## 2012-06-27 DIAGNOSIS — Z79899 Other long term (current) drug therapy: Secondary | ICD-10-CM | POA: Diagnosis not present

## 2012-07-03 ENCOUNTER — Ambulatory Visit
Admission: RE | Admit: 2012-07-03 | Discharge: 2012-07-03 | Disposition: A | Discharge status: Home / Self Care | Payer: Medicare Other | Source: Ambulatory Visit

## 2012-07-03 DIAGNOSIS — Z1231 Encounter for screening mammogram for malignant neoplasm of breast: Secondary | ICD-10-CM | POA: Diagnosis not present

## 2012-07-24 ENCOUNTER — Other Ambulatory Visit (HOSPITAL_COMMUNITY): Payer: Self-pay | Admitting: Internal Medicine

## 2012-07-26 ENCOUNTER — Encounter (HOSPITAL_COMMUNITY): Payer: Self-pay

## 2012-07-26 ENCOUNTER — Ambulatory Visit (HOSPITAL_COMMUNITY)
Admission: RE | Admit: 2012-07-26 | Discharge: 2012-07-26 | Disposition: A | Discharge status: Home / Self Care | Payer: Medicare Other | Source: Ambulatory Visit | Attending: Internal Medicine | Admitting: Internal Medicine

## 2012-07-26 DIAGNOSIS — M810 Age-related osteoporosis without current pathological fracture: Secondary | ICD-10-CM | POA: Insufficient documentation

## 2012-07-26 HISTORY — DX: Pure hypercholesterolemia, unspecified: E78.00

## 2012-07-26 HISTORY — DX: Essential (primary) hypertension: I10

## 2012-07-26 HISTORY — DX: Hypothyroidism, unspecified: E03.9

## 2012-07-26 MED ORDER — ZOLEDRONIC ACID 5 MG/100ML IV SOLN
5.0000 mg | Freq: Once | INTRAVENOUS | Status: AC
  Administered 2012-07-26: 5 mg via INTRAVENOUS
  Filled 2012-07-26: qty 100

## 2012-07-26 MED ORDER — SODIUM CHLORIDE 0.9 % IV SOLN
INTRAVENOUS | Status: DC
  Administered 2012-07-26: 20 mL/h via INTRAVENOUS

## 2012-07-30 DIAGNOSIS — H6120 Impacted cerumen, unspecified ear: Secondary | ICD-10-CM | POA: Diagnosis not present

## 2012-08-01 DIAGNOSIS — L723 Sebaceous cyst: Secondary | ICD-10-CM | POA: Diagnosis not present

## 2012-08-01 DIAGNOSIS — L270 Generalized skin eruption due to drugs and medicaments taken internally: Secondary | ICD-10-CM | POA: Diagnosis not present

## 2012-08-01 DIAGNOSIS — L819 Disorder of pigmentation, unspecified: Secondary | ICD-10-CM | POA: Diagnosis not present

## 2012-08-01 DIAGNOSIS — D1801 Hemangioma of skin and subcutaneous tissue: Secondary | ICD-10-CM | POA: Diagnosis not present

## 2012-08-01 DIAGNOSIS — L7211 Pilar cyst: Secondary | ICD-10-CM | POA: Diagnosis not present

## 2012-08-01 DIAGNOSIS — L821 Other seborrheic keratosis: Secondary | ICD-10-CM | POA: Diagnosis not present

## 2012-08-28 DIAGNOSIS — N909 Noninflammatory disorder of vulva and perineum, unspecified: Secondary | ICD-10-CM | POA: Diagnosis not present

## 2012-08-28 DIAGNOSIS — N952 Postmenopausal atrophic vaginitis: Secondary | ICD-10-CM | POA: Diagnosis not present

## 2012-08-28 DIAGNOSIS — Z124 Encounter for screening for malignant neoplasm of cervix: Secondary | ICD-10-CM | POA: Diagnosis not present

## 2012-10-02 DIAGNOSIS — H6120 Impacted cerumen, unspecified ear: Secondary | ICD-10-CM | POA: Diagnosis not present

## 2012-10-12 DIAGNOSIS — M5137 Other intervertebral disc degeneration, lumbosacral region: Secondary | ICD-10-CM | POA: Diagnosis not present

## 2012-10-12 DIAGNOSIS — M5030 Other cervical disc degeneration, unspecified cervical region: Secondary | ICD-10-CM | POA: Diagnosis not present

## 2012-10-12 DIAGNOSIS — M169 Osteoarthritis of hip, unspecified: Secondary | ICD-10-CM | POA: Diagnosis not present

## 2012-11-01 DIAGNOSIS — M542 Cervicalgia: Secondary | ICD-10-CM | POA: Diagnosis not present

## 2012-11-01 DIAGNOSIS — M545 Low back pain: Secondary | ICD-10-CM | POA: Diagnosis not present

## 2012-11-08 DIAGNOSIS — M545 Low back pain: Secondary | ICD-10-CM | POA: Diagnosis not present

## 2012-11-16 DIAGNOSIS — M542 Cervicalgia: Secondary | ICD-10-CM | POA: Diagnosis not present

## 2012-11-16 DIAGNOSIS — M545 Low back pain: Secondary | ICD-10-CM | POA: Diagnosis not present

## 2012-11-22 DIAGNOSIS — IMO0002 Reserved for concepts with insufficient information to code with codable children: Secondary | ICD-10-CM | POA: Diagnosis not present

## 2012-11-22 DIAGNOSIS — R21 Rash and other nonspecific skin eruption: Secondary | ICD-10-CM | POA: Diagnosis not present

## 2012-11-23 DIAGNOSIS — L270 Generalized skin eruption due to drugs and medicaments taken internally: Secondary | ICD-10-CM | POA: Diagnosis not present

## 2012-11-23 DIAGNOSIS — L940 Localized scleroderma [morphea]: Secondary | ICD-10-CM | POA: Diagnosis not present

## 2012-11-23 DIAGNOSIS — L259 Unspecified contact dermatitis, unspecified cause: Secondary | ICD-10-CM | POA: Diagnosis not present

## 2012-11-26 DIAGNOSIS — M1710 Unilateral primary osteoarthritis, unspecified knee: Secondary | ICD-10-CM | POA: Diagnosis not present

## 2012-11-30 DIAGNOSIS — M542 Cervicalgia: Secondary | ICD-10-CM | POA: Diagnosis not present

## 2012-11-30 DIAGNOSIS — M545 Low back pain: Secondary | ICD-10-CM | POA: Diagnosis not present

## 2012-12-03 DIAGNOSIS — M1710 Unilateral primary osteoarthritis, unspecified knee: Secondary | ICD-10-CM | POA: Diagnosis not present

## 2012-12-10 DIAGNOSIS — M1710 Unilateral primary osteoarthritis, unspecified knee: Secondary | ICD-10-CM | POA: Diagnosis not present

## 2012-12-20 DIAGNOSIS — M1710 Unilateral primary osteoarthritis, unspecified knee: Secondary | ICD-10-CM | POA: Diagnosis not present

## 2012-12-26 DIAGNOSIS — M1710 Unilateral primary osteoarthritis, unspecified knee: Secondary | ICD-10-CM | POA: Diagnosis not present

## 2013-01-17 DIAGNOSIS — Z23 Encounter for immunization: Secondary | ICD-10-CM | POA: Diagnosis not present

## 2013-01-28 DIAGNOSIS — H6120 Impacted cerumen, unspecified ear: Secondary | ICD-10-CM | POA: Diagnosis not present

## 2013-02-06 DIAGNOSIS — R300 Dysuria: Secondary | ICD-10-CM | POA: Diagnosis not present

## 2013-02-06 DIAGNOSIS — N952 Postmenopausal atrophic vaginitis: Secondary | ICD-10-CM | POA: Diagnosis not present

## 2013-02-06 DIAGNOSIS — B373 Candidiasis of vulva and vagina: Secondary | ICD-10-CM | POA: Diagnosis not present

## 2013-02-06 DIAGNOSIS — N899 Noninflammatory disorder of vagina, unspecified: Secondary | ICD-10-CM | POA: Diagnosis not present

## 2013-02-11 DIAGNOSIS — Z961 Presence of intraocular lens: Secondary | ICD-10-CM | POA: Diagnosis not present

## 2013-05-27 ENCOUNTER — Ambulatory Visit (INDEPENDENT_AMBULATORY_CARE_PROVIDER_SITE_OTHER): Payer: Medicare Other | Admitting: Surgery

## 2013-05-27 ENCOUNTER — Encounter (INDEPENDENT_AMBULATORY_CARE_PROVIDER_SITE_OTHER): Payer: Self-pay

## 2013-05-27 ENCOUNTER — Encounter (INDEPENDENT_AMBULATORY_CARE_PROVIDER_SITE_OTHER): Payer: Self-pay | Admitting: Surgery

## 2013-05-27 VITALS — BP 124/80 | HR 77 | Temp 97.5°F | Resp 16 | Ht 66.0 in | Wt 131.0 lb

## 2013-05-27 DIAGNOSIS — L723 Sebaceous cyst: Secondary | ICD-10-CM | POA: Diagnosis not present

## 2013-05-27 NOTE — Progress Notes (Signed)
Patient ID: Shelley Cook, female   DOB: 1929-11-21, 80 y.o.   MRN: [id number]  Chief Complaint  Patient presents with  . Cyst    scalp    HPI Shelley Cook is a 80 y.o. female.  Patient sent at the request of Dr.Avva for cyst on scalp. Is been present for many years. It has enlarged in size recently. He is causing mild to moderate discomfort. The patient's anterior scalp. There is no drainage or redness. No fevers chills. HPI  Past Medical History  Diagnosis Date  . Hypothyroidism   . Hypertension   . High cholesterol     Past Surgical History  Procedure Laterality Date  . Tonsillectomy    . Abdominal hysterectomy    . Left heel 1981    . Hip replacement left replacement 2009    . Hernia repair  2008    left ingunial  . Back surgery  2009    History reviewed. No pertinent family history.  Social History History  Substance Use Topics  . Smoking status: Never Smoker   . Smokeless tobacco: Not on file  . Alcohol Use: No    Allergies  Allergen Reactions  . Celebrex [Celecoxib] Other (See Comments)    Pt. Felt out of this world  . Hctz [Hydrochlorothiazide] Other (See Comments)    Severe dehydration  . Levaquin [Levofloxacin In D5w] Swelling  . Sudafed [Pseudoephedrine Hcl] Swelling  . Aleve [Naproxen Sodium] Itching, Swelling and Rash  . Asa [Aspirin] Itching, Swelling and Rash  . Augmentin [Amoxicillin-Pot Clavulanate] Itching, Swelling and Rash  . Ceftin [Cefuroxime Axetil] Itching, Swelling and Rash  . Ibuprofen Itching, Swelling and Rash  . Keflex [Cephalexin] Itching, Swelling and Rash  . Nitrofurantoin Swelling and Rash  . Nsaids Itching, Swelling and Rash  . Penicillins Itching, Swelling and Rash  . Sulfa Antibiotics Itching, Swelling and Rash  . Zithromax [Azithromycin] Itching, Swelling and Rash    Current Outpatient Prescriptions  Medication Sig Dispense Refill  . aspirin 325 MG EC tablet Take 81 mg by mouth every evening. Preventive per patient       . calcium-vitamin D (OSCAL WITH D) 500-200 MG-UNIT per tablet Take 1 tablet by mouth daily.      . carvedilol (COREG) 25 MG tablet Take 25 mg by mouth 2 (two) times daily with a meal.      . cloNIDine (CATAPRES) 0.1 MG tablet Take 0.1 mg by mouth 2 (two) times daily.      [name] levothyroxine (SYNTHROID, LEVOTHROID) 88 MCG tablet Take 88 mcg by mouth daily before breakfast.      . simvastatin (ZOCOR) 20 MG tablet Take 20 mg by mouth every evening.       No current facility-administered medications for this visit.    Review of Systems Review of Systems  Constitutional: Negative for fever, chills and unexpected weight change.  HENT: Negative for congestion, hearing loss, sore throat, trouble swallowing and voice change.   Eyes: Negative for visual disturbance.  Respiratory: Negative for cough and wheezing.   Cardiovascular: Negative for chest pain, palpitations and leg swelling.  Gastrointestinal: Negative for nausea, vomiting, abdominal pain, diarrhea, constipation, blood in stool, abdominal distention and anal bleeding.  Genitourinary: Negative for hematuria, vaginal bleeding and difficulty urinating.  Musculoskeletal: Negative for arthralgias.  Skin: Negative for rash and wound.  Neurological: Negative for seizures, syncope and headaches.  Hematological: Negative for adenopathy. Does not bruise/bleed easily.  Psychiatric/Behavioral: Negative for confusion.    Blood pressure  124/80, pulse 77, temperature 97.5 F (36.4 C), temperature source Oral, resp. rate 16, height 5\' 6"  (1.676 m), weight 131 lb (59.421 kg).  Physical Exam Physical Exam  Constitutional: She is oriented to person, place, and time. She appears well-developed and well-nourished.  HENT:  Head:    Musculoskeletal: Normal range of motion.  Neurological: She is alert and oriented to person, place, and time.  Skin: Skin is warm and dry.  Psychiatric: She has a normal mood and affect. Her behavior is normal. Judgment  and thought content normal.    Data Reviewed none  Assessment    2 cm x 2 cm sebaceous cyst anterior scalp    Plan    Patient was like this removed because it is getting larger.The procedure has been discussed with the patient.  Alternative therapies have been discussed with the patient.  Operative risks include bleeding,  Infection,  Organ injury,  Nerve injury,  Blood vessel injury,  DVT,  Pulmonary embolism,  Death,  And possible reoperation.  Medical management risks include worsening of present situation.  The success of the procedure is 50 -90 % at treating patients symptoms.  The patient understands and agrees to proceed.       Cass Vandermeulen A. 05/27/2013, 3:49 PM

## 2013-05-27 NOTE — Patient Instructions (Signed)

## 2013-06-06 ENCOUNTER — Encounter (HOSPITAL_BASED_OUTPATIENT_CLINIC_OR_DEPARTMENT_OTHER): Payer: Self-pay | Admitting: *Deleted

## 2013-06-06 NOTE — Progress Notes (Signed)
To come in for bmet-ekg-fairly healthy 80 yr old

## 2013-06-18 DIAGNOSIS — H6120 Impacted cerumen, unspecified ear: Secondary | ICD-10-CM | POA: Diagnosis not present

## 2013-06-24 ENCOUNTER — Other Ambulatory Visit: Payer: Self-pay

## 2013-06-24 DIAGNOSIS — Z1231 Encounter for screening mammogram for malignant neoplasm of breast: Secondary | ICD-10-CM

## 2013-06-24 DIAGNOSIS — M810 Age-related osteoporosis without current pathological fracture: Secondary | ICD-10-CM | POA: Diagnosis not present

## 2013-06-24 DIAGNOSIS — E785 Hyperlipidemia, unspecified: Secondary | ICD-10-CM | POA: Diagnosis not present

## 2013-06-24 DIAGNOSIS — I10 Essential (primary) hypertension: Secondary | ICD-10-CM | POA: Diagnosis not present

## 2013-06-24 DIAGNOSIS — E039 Hypothyroidism, unspecified: Secondary | ICD-10-CM | POA: Diagnosis not present

## 2013-07-01 ENCOUNTER — Encounter (INDEPENDENT_AMBULATORY_CARE_PROVIDER_SITE_OTHER): Payer: Medicare Other | Admitting: Surgery

## 2013-07-01 DIAGNOSIS — Z1331 Encounter for screening for depression: Secondary | ICD-10-CM | POA: Diagnosis not present

## 2013-07-01 DIAGNOSIS — E785 Hyperlipidemia, unspecified: Secondary | ICD-10-CM | POA: Diagnosis not present

## 2013-07-01 DIAGNOSIS — K5900 Constipation, unspecified: Secondary | ICD-10-CM | POA: Diagnosis not present

## 2013-07-01 DIAGNOSIS — Z23 Encounter for immunization: Secondary | ICD-10-CM | POA: Diagnosis not present

## 2013-07-01 DIAGNOSIS — E039 Hypothyroidism, unspecified: Secondary | ICD-10-CM | POA: Diagnosis not present

## 2013-07-01 DIAGNOSIS — M810 Age-related osteoporosis without current pathological fracture: Secondary | ICD-10-CM | POA: Diagnosis not present

## 2013-07-01 DIAGNOSIS — IMO0002 Reserved for concepts with insufficient information to code with codable children: Secondary | ICD-10-CM | POA: Diagnosis not present

## 2013-07-01 DIAGNOSIS — Z0000 Encounter for general adult medical examination without abnormal findings: Secondary | ICD-10-CM | POA: Diagnosis not present

## 2013-07-01 DIAGNOSIS — L723 Sebaceous cyst: Secondary | ICD-10-CM | POA: Diagnosis not present

## 2013-07-01 DIAGNOSIS — I10 Essential (primary) hypertension: Secondary | ICD-10-CM | POA: Diagnosis not present

## 2013-07-02 DIAGNOSIS — Z1212 Encounter for screening for malignant neoplasm of rectum: Secondary | ICD-10-CM | POA: Diagnosis not present

## 2013-07-05 ENCOUNTER — Encounter (INDEPENDENT_AMBULATORY_CARE_PROVIDER_SITE_OTHER): Payer: Medicare Other | Admitting: Surgery

## 2013-07-08 ENCOUNTER — Ambulatory Visit
Admission: RE | Admit: 2013-07-08 | Discharge: 2013-07-08 | Disposition: A | Discharge status: Home / Self Care | Payer: Medicare Other | Source: Ambulatory Visit

## 2013-07-08 DIAGNOSIS — Z1231 Encounter for screening mammogram for malignant neoplasm of breast: Secondary | ICD-10-CM | POA: Diagnosis not present

## 2013-07-09 DIAGNOSIS — M810 Age-related osteoporosis without current pathological fracture: Secondary | ICD-10-CM | POA: Diagnosis not present

## 2013-07-12 NOTE — Progress Notes (Signed)
Reviewed preop -to come in for bmet-ekg

## 2013-07-16 ENCOUNTER — Encounter (HOSPITAL_BASED_OUTPATIENT_CLINIC_OR_DEPARTMENT_OTHER)
Admission: RE | Admit: 2013-07-16 | Discharge: 2013-07-16 | Disposition: A | Discharge status: Home / Self Care | Payer: Medicare Other | Source: Ambulatory Visit | Attending: Surgery | Admitting: Surgery

## 2013-07-16 DIAGNOSIS — E7800 Pure hypercholesterolemia, unspecified: Secondary | ICD-10-CM | POA: Diagnosis not present

## 2013-07-16 DIAGNOSIS — I10 Essential (primary) hypertension: Secondary | ICD-10-CM | POA: Diagnosis not present

## 2013-07-16 DIAGNOSIS — E039 Hypothyroidism, unspecified: Secondary | ICD-10-CM | POA: Diagnosis not present

## 2013-07-16 DIAGNOSIS — Z01810 Encounter for preprocedural cardiovascular examination: Secondary | ICD-10-CM | POA: Diagnosis not present

## 2013-07-16 DIAGNOSIS — D234 Other benign neoplasm of skin of scalp and neck: Secondary | ICD-10-CM | POA: Diagnosis not present

## 2013-07-16 DIAGNOSIS — Z96649 Presence of unspecified artificial hip joint: Secondary | ICD-10-CM | POA: Diagnosis not present

## 2013-07-16 DIAGNOSIS — Z7982 Long term (current) use of aspirin: Secondary | ICD-10-CM | POA: Diagnosis not present

## 2013-07-16 LAB — BASIC METABOLIC PANEL
BUN: 15 mg/dL (ref 6–23)
CALCIUM: 9.3 mg/dL (ref 8.4–10.5)
CO2: 26 meq/L (ref 19–32)
Chloride: 100 mEq/L (ref 96–112)
Creatinine, Ser: 0.85 mg/dL (ref 0.50–1.10)
GFR calc Af Amer: 72 mL/min — ABNORMAL LOW (ref >90)
GFR, EST NON AFRICAN AMERICAN: 63 mL/min — AB (ref >90)
Glucose, Bld: 77 mg/dL (ref 70–99)
POTASSIUM: 5.1 meq/L (ref 3.7–5.3)
SODIUM: 136 meq/L — AB (ref 137–147)

## 2013-07-18 ENCOUNTER — Ambulatory Visit (HOSPITAL_BASED_OUTPATIENT_CLINIC_OR_DEPARTMENT_OTHER): Payer: Medicare Other | Admitting: Certified Registered"

## 2013-07-18 ENCOUNTER — Encounter (HOSPITAL_BASED_OUTPATIENT_CLINIC_OR_DEPARTMENT_OTHER): Payer: Self-pay | Admitting: *Deleted

## 2013-07-18 ENCOUNTER — Encounter (HOSPITAL_BASED_OUTPATIENT_CLINIC_OR_DEPARTMENT_OTHER): Payer: Medicare Other | Admitting: Certified Registered"

## 2013-07-18 ENCOUNTER — Ambulatory Visit (HOSPITAL_BASED_OUTPATIENT_CLINIC_OR_DEPARTMENT_OTHER)
Admission: RE | Admit: 2013-07-18 | Discharge: 2013-07-18 | Disposition: A | Discharge status: Home / Self Care | Payer: Medicare Other | Source: Ambulatory Visit | Attending: Surgery | Admitting: Surgery

## 2013-07-18 ENCOUNTER — Encounter (HOSPITAL_BASED_OUTPATIENT_CLINIC_OR_DEPARTMENT_OTHER)
Admission: RE | Disposition: A | Discharge status: Home / Self Care | Payer: Self-pay | Source: Ambulatory Visit | Attending: Surgery

## 2013-07-18 DIAGNOSIS — E7800 Pure hypercholesterolemia, unspecified: Secondary | ICD-10-CM | POA: Diagnosis not present

## 2013-07-18 DIAGNOSIS — Z7982 Long term (current) use of aspirin: Secondary | ICD-10-CM | POA: Insufficient documentation

## 2013-07-18 DIAGNOSIS — I10 Essential (primary) hypertension: Secondary | ICD-10-CM | POA: Insufficient documentation

## 2013-07-18 DIAGNOSIS — Z96649 Presence of unspecified artificial hip joint: Secondary | ICD-10-CM | POA: Insufficient documentation

## 2013-07-18 DIAGNOSIS — D234 Other benign neoplasm of skin of scalp and neck: Secondary | ICD-10-CM | POA: Diagnosis not present

## 2013-07-18 DIAGNOSIS — E039 Hypothyroidism, unspecified: Secondary | ICD-10-CM | POA: Diagnosis not present

## 2013-07-18 DIAGNOSIS — L723 Sebaceous cyst: Secondary | ICD-10-CM | POA: Diagnosis not present

## 2013-07-18 DIAGNOSIS — Z01810 Encounter for preprocedural cardiovascular examination: Secondary | ICD-10-CM | POA: Diagnosis not present

## 2013-07-18 DIAGNOSIS — R220 Localized swelling, mass and lump, head: Secondary | ICD-10-CM

## 2013-07-18 HISTORY — DX: Presence of spectacles and contact lenses: Z97.3

## 2013-07-18 HISTORY — DX: Unspecified osteoarthritis, unspecified site: M19.90

## 2013-07-18 HISTORY — PX: MASS EXCISION: SHX2000

## 2013-07-18 LAB — POCT HEMOGLOBIN-HEMACUE: Hemoglobin: 13 g/dL (ref 12.0–15.0)

## 2013-07-18 SURGERY — EXCISION MASS
Anesthesia: General | Site: Scalp

## 2013-07-18 MED ORDER — CLINDAMYCIN PHOSPHATE 300 MG/50ML IV SOLN
300.0000 mg | Freq: Once | INTRAVENOUS | Status: AC
  Administered 2013-07-18: 300 mg via INTRAVENOUS

## 2013-07-18 MED ORDER — MIDAZOLAM HCL 2 MG/2ML IJ SOLN: 1.0000 mg | INTRAMUSCULAR | Status: DC | PRN

## 2013-07-18 MED ORDER — PROPOFOL 10 MG/ML IV BOLUS
INTRAVENOUS | Status: AC
  Filled 2013-07-18: qty 20

## 2013-07-18 MED ORDER — FENTANYL CITRATE 0.05 MG/ML IJ SOLN: 25.0000 ug | INTRAMUSCULAR | Status: DC | PRN

## 2013-07-18 MED ORDER — CLINDAMYCIN PHOSPHATE 300 MG/50ML IV SOLN
INTRAVENOUS | Status: AC
  Filled 2013-07-18: qty 50

## 2013-07-18 MED ORDER — HYDROCODONE-ACETAMINOPHEN 5-325 MG PO TABS: 1.0000 | ORAL_TABLET | Freq: Four times a day (QID) | ORAL | Status: DC | PRN

## 2013-07-18 MED ORDER — ONDANSETRON HCL 4 MG/2ML IJ SOLN
INTRAMUSCULAR | Status: DC | PRN
  Administered 2013-07-18: 4 mg via INTRAVENOUS

## 2013-07-18 MED ORDER — FENTANYL CITRATE 0.05 MG/ML IJ SOLN
INTRAMUSCULAR | Status: DC | PRN
  Administered 2013-07-18: 50 ug via INTRAVENOUS

## 2013-07-18 MED ORDER — FENTANYL CITRATE 0.05 MG/ML IJ SOLN: 50.0000 ug | INTRAMUSCULAR | Status: DC | PRN

## 2013-07-18 MED ORDER — LIDOCAINE HCL (CARDIAC) 20 MG/ML IV SOLN
INTRAVENOUS | Status: DC | PRN
  Administered 2013-07-18: 40 mg via INTRAVENOUS

## 2013-07-18 MED ORDER — LACTATED RINGERS IV SOLN
INTRAVENOUS | Status: DC
  Administered 2013-07-18 (×2): via INTRAVENOUS

## 2013-07-18 MED ORDER — BUPIVACAINE-EPINEPHRINE PF 0.25-1:200000 % IJ SOLN
INTRAMUSCULAR | Status: AC
  Filled 2013-07-18: qty 30

## 2013-07-18 MED ORDER — BUPIVACAINE-EPINEPHRINE 0.25% -1:200000 IJ SOLN
INTRAMUSCULAR | Status: DC | PRN
  Administered 2013-07-18: 4 mL

## 2013-07-18 MED ORDER — FENTANYL CITRATE 0.05 MG/ML IJ SOLN
INTRAMUSCULAR | Status: AC
  Filled 2013-07-18: qty 4

## 2013-07-18 MED ORDER — PROPOFOL 10 MG/ML IV BOLUS
INTRAVENOUS | Status: DC | PRN
  Administered 2013-07-18: 100 mg via INTRAVENOUS

## 2013-07-18 MED ORDER — EPHEDRINE SULFATE 50 MG/ML IJ SOLN
INTRAMUSCULAR | Status: DC | PRN
  Administered 2013-07-18: 10 mg via INTRAVENOUS

## 2013-07-18 MED ORDER — ARTIFICIAL TEARS OP OINT
TOPICAL_OINTMENT | OPHTHALMIC | Status: DC | PRN
  Administered 2013-07-18: 1 via OPHTHALMIC

## 2013-07-18 MED ORDER — MIDAZOLAM HCL 2 MG/2ML IJ SOLN
INTRAMUSCULAR | Status: AC
  Filled 2013-07-18: qty 2

## 2013-07-18 SURGICAL SUPPLY — 53 items
BENZOIN TINCTURE PRP APPL 2/3 (GAUZE/BANDAGES/DRESSINGS) IMPLANT
BLADE 10 SAFETY STRL DISP (BLADE) IMPLANT
BLADE 15 SAFETY STRL DISP (BLADE) ×3 IMPLANT
BLADE SURG ROTATE 9660 (MISCELLANEOUS) ×3 IMPLANT
CANISTER SUCT 1200ML W/VALVE (MISCELLANEOUS) ×3 IMPLANT
CHLORAPREP W/TINT 26ML (MISCELLANEOUS) IMPLANT
CLOSURE WOUND 1/2 X4 (GAUZE/BANDAGES/DRESSINGS)
COVER MAYO STAND STRL (DRAPES) ×3 IMPLANT
COVER TABLE BACK 60X90 (DRAPES) ×3 IMPLANT
DECANTER SPIKE VIAL GLASS SM (MISCELLANEOUS) IMPLANT
DERMABOND ADVANCED (GAUZE/BANDAGES/DRESSINGS) ×2
DERMABOND ADVANCED .7 DNX12 (GAUZE/BANDAGES/DRESSINGS) ×1 IMPLANT
DRAPE PED LAPAROTOMY (DRAPES) ×3 IMPLANT
DRAPE UTILITY XL STRL (DRAPES) IMPLANT
ELECT COATED BLADE 2.86 ST (ELECTRODE) ×3 IMPLANT
ELECT REM PT RETURN 9FT ADLT (ELECTROSURGICAL) ×3
ELECTRODE REM PT RTRN 9FT ADLT (ELECTROSURGICAL) ×1 IMPLANT
GLOVE BIO SURGEON STRL SZ 6.5 (GLOVE) ×2 IMPLANT
GLOVE BIO SURGEONS STRL SZ 6.5 (GLOVE) ×1
GLOVE BIOGEL PI IND STRL 6.5 (GLOVE) ×1 IMPLANT
GLOVE BIOGEL PI IND STRL 7.5 (GLOVE) ×1 IMPLANT
GLOVE BIOGEL PI IND STRL 8 (GLOVE) ×1 IMPLANT
GLOVE BIOGEL PI INDICATOR 6.5 (GLOVE) ×2
GLOVE BIOGEL PI INDICATOR 7.5 (GLOVE) ×2
GLOVE BIOGEL PI INDICATOR 8 (GLOVE) ×2
GLOVE ECLIPSE 8.0 STRL XLNG CF (GLOVE) ×3 IMPLANT
GLOVE SURG SS PI 7.5 STRL IVOR (GLOVE) ×3 IMPLANT
GOWN STRL REUS W/ TWL LRG LVL3 (GOWN DISPOSABLE) ×2 IMPLANT
GOWN STRL REUS W/ TWL XL LVL3 (GOWN DISPOSABLE) ×1 IMPLANT
GOWN STRL REUS W/TWL LRG LVL3 (GOWN DISPOSABLE) ×4
GOWN STRL REUS W/TWL XL LVL3 (GOWN DISPOSABLE) ×2
NEEDLE HYPO 25X1 1.5 SAFETY (NEEDLE) ×3 IMPLANT
NS IRRIG 1000ML POUR BTL (IV SOLUTION) IMPLANT
PACK BASIN DAY SURGERY FS (CUSTOM PROCEDURE TRAY) ×3 IMPLANT
PENCIL BUTTON HOLSTER BLD 10FT (ELECTRODE) ×3 IMPLANT
SLEEVE SCD COMPRESS KNEE MED (MISCELLANEOUS) ×3 IMPLANT
SPONGE LAP 4X18 X RAY DECT (DISPOSABLE) IMPLANT
STAPLER VISISTAT 35W (STAPLE) IMPLANT
STRIP CLOSURE SKIN 1/2X4 (GAUZE/BANDAGES/DRESSINGS) IMPLANT
SUCTION FRAZIER TIP 10 FR DISP (SUCTIONS) ×3 IMPLANT
SUT MON AB 4-0 PC3 18 (SUTURE) ×3 IMPLANT
SUT VIC AB 3-0 SH 27 (SUTURE) ×2
SUT VIC AB 3-0 SH 27X BRD (SUTURE) ×1 IMPLANT
SUT VICRYL 0 UR6 27IN ABS (SUTURE) ×3 IMPLANT
SUT VICRYL AB 3 0 TIES (SUTURE) IMPLANT
SYR CONTROL 10ML LL (SYRINGE) ×3 IMPLANT
TOWEL OR 17X24 6PK STRL BLUE (TOWEL DISPOSABLE) ×6 IMPLANT
TOWEL OR NON WOVEN STRL DISP B (DISPOSABLE) IMPLANT
TRAY DSU PREP LF (CUSTOM PROCEDURE TRAY) IMPLANT
TUBE CONNECTING 20'X1/4 (TUBING) ×1
TUBE CONNECTING 20X1/4 (TUBING) ×2 IMPLANT
UNDERPAD 30X30 INCONTINENT (UNDERPADS AND DIAPERS) ×3 IMPLANT
YANKAUER SUCT BULB TIP NO VENT (SUCTIONS) IMPLANT

## 2013-07-18 NOTE — Anesthesia Procedure Notes (Addendum)
Procedure Name: LMA Insertion Date/Time: 07/18/2013 7:44 AM Performed by: Baxter Flattery Pre-anesthesia Checklist: Patient identified, Emergency Drugs available, Suction available and Patient being monitored Patient Re-evaluated:Patient Re-evaluated prior to inductionOxygen Delivery Method: Circle System Utilized Preoxygenation: Pre-oxygenation with 100% oxygen Intubation Type: IV induction Ventilation: Mask ventilation without difficulty LMA: LMA inserted and LMA flexible inserted LMA Size: 3.0 Number of attempts: 1 Airway Equipment and Method: bite block Placement Confirmation: positive ETCO2 and breath sounds checked- equal and bilateral Tube secured with: Tape Dental Injury: Teeth and Oropharynx as per pre-operative assessment

## 2013-07-18 NOTE — Interval H&P Note (Signed)
History and Physical Interval Note:  07/18/2013 7:33 AM  [name]  has presented today for surgery, with the diagnosis of cyst  The various methods of treatment have been discussed with the patient and family. After consideration of risks, benefits and other options for treatment, the patient has consented to  Procedure(s): EXCISION SEBACEOUS CYST SCALP (N/A) as a surgical intervention .  The patient's history has been reviewed, patient examined, no change in status, stable for surgery.  I have reviewed the patient's chart and labs.  Questions were answered to the patient's satisfaction.     Demico Ploch A. Piya Mesch

## 2013-07-18 NOTE — Anesthesia Postprocedure Evaluation (Signed)
  Anesthesia Post-op Note  Patient: Shelley Cook  Procedure(s) Performed: Procedure(s): EXCISION SEBACEOUS CYST SCALP (N/A)  Patient Location: PACU  Anesthesia Type:General  Level of Consciousness: awake and alert   Airway and Oxygen Therapy: Patient Spontanous Breathing  Post-op Pain: none  Post-op Assessment: Post-op Vital signs reviewed, Patient's Cardiovascular Status Stable and Respiratory Function Stable  Post-op Vital Signs: Reviewed  Filed Vitals:   07/18/13 0845  BP: 113/61  Pulse:   Temp:   Resp:     Complications: No apparent anesthesia complications

## 2013-07-18 NOTE — H&P (Signed)
Transplants    None    Demographics Shelley Cook 80 year old female  [place] [address] [phone] (H)  Comm Pref: None [address]  [place] [address] [phone] (H)   Problem ListHospitalization ProblemNone  Significant History/Details  Smoking: Never Smoker   Smokeless Tobacco: Unknown  Alcohol: No  3 open orders  Preferred Language: English  Dialysis HistoryNone   Currently admitted as of 4/30/2015Specialty CommentsEditShow AllReport03/09/15:PT SIGNED PHI FOR Shelley Cook 09/05/1928 DOW 06/12/13 TC-CDS-OP- Exc sq cyst scalp/de 05/27/13 21012 05-29-13 pt op sx schd 0-63-01 @ [facility] no precert required ip/op mcr, secondary supplement no precert required. (de,tvp) 06/10/13 DOS chg from 3/25 to 4/30 chg sheet out, de 06/10/13 sent new ABN to sign and return, de 06-10-13 pt op sx schd 08-20-07 @ [facility] no precert required ip/op mcr, secondary supplement no precert required. (de,tvp)   MedicationsHospital Medications Outpatient Medications   New medications from outside sources are available for reconciliation  clindamycin (CLEOCIN) IVPB 300 mg  fentaNYL (SUBLIMAZE) injection 50-100 mcg  lactated ringers infusion  midazolam (VERSED) injection 1-2 mg    Preferred Labs   None   Transplant-Related Biopsies (11 years) ** None **  Patient Blood Type (50 years)   None                                 Recent Visits (Maximum of 10 visits)Date Type Provider Description  07/18/2013 Surgery Shelley Cook A., MD   05/27/2013 Shelley Cook   05/27/2013 Office Visit Shelley Cook A., MD Sebaceous Cyst (Primary Dx)         My Last Outpatient Progress NoteStatus Last Edited Encounter Date  Signed Mon May 27, 2013 3:52 PM EDT 05/27/2013  Patient ID: Shelley Cook, female   DOB: 1929/08/20, 80 y.o.   MRN: [id number]    Chief Complaint   Patient presents with   .  Cyst       scalp      HPI Shelley Cook is a 80 y.o. female.   Patient sent at the request of Dr.Avva for cyst on scalp. Is been present for many years. It has enlarged in size recently. He is causing mild to moderate discomfort. The patient's anterior scalp. There is no drainage or redness. No fevers chills. HPI    Past Medical History   Diagnosis  Date   .  Hypothyroidism     .  Hypertension     .  High cholesterol         Past Surgical History   Procedure  Laterality  Date   .  Tonsillectomy       .  Abdominal hysterectomy       .  Left heel 1981       .  Hip replacement left replacement 2009       .  Hernia repair    2008       left ingunial   .  Back surgery    2009      History reviewed. No pertinent family history.   Social History History   Substance Use Topics   .  Smoking status:  Never Smoker    .  Smokeless tobacco:  Not on file   .  Alcohol Use:  No       Allergies   Allergen  Reactions   .  Celebrex [Celecoxib]  Other (  See Comments)       Pt. Felt out of this world   .  Hctz [Hydrochlorothiazide]  Other (See Comments)       Severe dehydration   .  Levaquin [Levofloxacin In D5w]  Swelling   .  Sudafed [Pseudoephedrine Hcl]  Swelling   .  Aleve [Naproxen Sodium]  Itching, Swelling and Rash   .  Asa [Aspirin]  Itching, Swelling and Rash   .  Augmentin [Amoxicillin-Pot Clavulanate]  Itching, Swelling and Rash   .  Ceftin [Cefuroxime Axetil]  Itching, Swelling and Rash   .  Ibuprofen  Itching, Swelling and Rash   .  Keflex [Cephalexin]  Itching, Swelling and Rash   .  Nitrofurantoin  Swelling and Rash   .  Nsaids  Itching, Swelling and Rash   .  Penicillins  Itching, Swelling and Rash   .  Sulfa Antibiotics  Itching, Swelling and Rash   .  Zithromax [Azithromycin]  Itching, Swelling and Rash       Current Outpatient Prescriptions   Medication  Sig  Dispense  Refill   .  aspirin 325 MG EC tablet  Take 81 mg by mouth every evening. Preventive per patient         .  calcium-vitamin D (OSCAL WITH D) 500-200 MG-UNIT  per tablet  Take 1 tablet by mouth daily.         .  carvedilol (COREG) 25 MG tablet  Take 25 mg by mouth 2 (two) times daily with a meal.         .  cloNIDine (CATAPRES) 0.1 MG tablet  Take 0.1 mg by mouth 2 (two) times daily.         [name]  levothyroxine (SYNTHROID, LEVOTHROID) 88 MCG tablet  Take 88 mcg by mouth daily before breakfast.         .  simvastatin (ZOCOR) 20 MG tablet  Take 20 mg by mouth every evening.             No current facility-administered medications for this visit.      Review of Systems Review of Systems  Constitutional: Negative for fever, chills and unexpected weight change.  HENT: Negative for congestion, hearing loss, sore throat, trouble swallowing and voice change.   Eyes: Negative for visual disturbance.  Respiratory: Negative for cough and wheezing.   Cardiovascular: Negative for chest pain, palpitations and leg swelling.  Gastrointestinal: Negative for nausea, vomiting, abdominal pain, diarrhea, constipation, blood in stool, abdominal distention and anal bleeding.  Genitourinary: Negative for hematuria, vaginal bleeding and difficulty urinating.  Musculoskeletal: Negative for arthralgias.  Skin: Negative for rash and wound.  Neurological: Negative for seizures, syncope and headaches.  Hematological: Negative for adenopathy. Does not bruise/bleed easily.  Psychiatric/Behavioral: Negative for confusion.    Blood pressure 124/80, pulse 77, temperature 97.5 F (36.4 C), temperature source Oral, resp. rate 16, height 5\' 6"  (1.676 m), weight 131 lb (59.421 kg).   Physical Exam Physical Exam  Constitutional: She is oriented to person, place, and time. She appears well-developed and well-nourished.  HENT:   Head:    Musculoskeletal: Normal range of motion.  Neurological: She is alert and oriented to person, place, and time.  Skin: Skin is warm and dry.  Psychiatric: She has a normal mood and affect. Her behavior is normal. Judgment and thought content  normal.    Data Reviewed none   Assessment 2 cm x 2 cm sebaceous cyst anterior scalp   Plan Patient  was like this removed because it is getting larger.The procedure has been discussed with the patient.  Alternative therapies have been discussed with the patient.  Operative risks include bleeding,  Infection,  Organ injury,  Nerve injury,  Blood vessel injury,  DVT,  Pulmonary embolism,  Death,  And possible reoperation.  Medical management risks include worsening of present situation.  The success of the procedure is 50 -90 % at treating patients symptoms.  The patient understands and agrees to proceed.       Jones Viviani A.

## 2013-07-18 NOTE — Transfer of Care (Signed)
Immediate Anesthesia Transfer of Care Note  Patient: Shelley Cook  Procedure(s) Performed: Procedure(s): EXCISION SEBACEOUS CYST SCALP (N/A)  Patient Location: PACU  Anesthesia Type:General  Level of Consciousness: awake, alert  and oriented  Airway & Oxygen Therapy: Patient Spontanous Breathing and Patient connected to face mask oxygen  Post-op Assessment: Report given to PACU RN, Post -op Vital signs reviewed and stable and Patient moving all extremities  Post vital signs: Reviewed and stable  Complications: No apparent anesthesia complications

## 2013-07-18 NOTE — Brief Op Note (Signed)
07/18/2013  8:13 AM  PATIENT:  Shelley Cook  80 y.o. female  PRE-OPERATIVE DIAGNOSIS:  cyst  POST-OPERATIVE DIAGNOSIS:  cyst of scalp  PROCEDURE:  Procedure(s): EXCISION SEBACEOUS CYST SCALP (N/A)  SURGEON:  Surgeon(s) and Role:    * Johne Buckle A. Teala Daffron, MD - Primary     ANESTHESIA:   local and general  EBL:  Total I/O In: 900 [I.V.:900] Out: -   BLOOD ADMINISTERED:none    LOCAL MEDICATIONS USED:  MARCAINE     SPECIMEN:  Source of Specimen:  scalp cyst  DISPOSITION OF SPECIMEN:  PATHOLOGY  COUNTS:  YES  TOURNIQUET:  * No tourniquets in log *  DICTATION: .Other Dictation: Dictation Number [id number]  PLAN OF CARE: Discharge to home after PACU  PATIENT DISPOSITION:  PACU - hemodynamically stable.   Delay start of Pharmacological VTE agent (>24hrs) due to surgical blood loss or risk of bleeding: not applicable

## 2013-07-18 NOTE — Discharge Instructions (Signed)
GENERAL SURGERY: POST OP INSTRUCTIONS ° °1. DIET: Follow a light bland diet the first 24 hours after arrival home, such as soup, liquids, crackers, etc.  Be sure to include lots of fluids daily.  Avoid fast food or heavy meals as your are more likely to get nauseated.   °2. Take your usually prescribed home medications unless otherwise directed. °3. PAIN CONTROL: °a. Pain is best controlled by a usual combination of three different methods TOGETHER: °i. Ice/Heat °ii. Over the counter pain medication °iii. Prescription pain medication °b. Most patients will experience some swelling and bruising around the incisions.  Ice packs or heating pads (30-60 minutes up to 6 times a day) will help. Use ice for the first few days to help decrease swelling and bruising, then switch to heat to help relax tight/sore spots and speed recovery.  Some people prefer to use ice alone, heat alone, alternating between ice & heat.  Experiment to what works for you.  Swelling and bruising can take several weeks to resolve.   °c. It is helpful to take an over-the-counter pain medication regularly for the first few weeks.  Choose one of the following that works best for you: °i. Naproxen (Aleve, etc)  Two 220mg tabs twice a day °ii. Ibuprofen (Advil, etc) Three 200mg tabs four times a day (every meal & bedtime) °iii. Acetaminophen (Tylenol, etc) 500-650mg four times a day (every meal & bedtime) °d. A  prescription for pain medication (such as oxycodone, hydrocodone, etc) should be given to you upon discharge.  Take your pain medication as prescribed.  °i. If you are having problems/concerns with the prescription medicine (does not control pain, nausea, vomiting, rash, itching, etc), please call us [phone] to see if we need to switch you to a different pain medicine that will work better for you and/or control your side effect better. °ii. If you need a refill on your pain medication, please contact your pharmacy.  They will contact our  office to request authorization. Prescriptions will not be filled after 5 pm or on week-ends. °4. Avoid getting constipated.  Between the surgery and the pain medications, it is common to experience some constipation.  Increasing fluid intake and taking a fiber supplement (such as Metamucil, Citrucel, FiberCon, MiraLax, etc) 1-2 times a day regularly will usually help prevent this problem from occurring.  A mild laxative (prune juice, Milk of Magnesia, MiraLax, etc) should be taken according to package directions if there are no bowel movements after 48 hours.   °5. Wash / shower every day.  You may shower over the dressings as they are waterproof.  Continue to shower over incision(s) after the dressing is off. °6. Remove your waterproof bandages 5 days after surgery.  You may leave the incision open to air.  You may have skin tapes (Steri Strips) covering the incision(s).  Leave them on until one week, then remove.  You may replace a dressing/Band-Aid to cover the incision for comfort if you wish.  ° ° ° ° °7. ACTIVITIES as tolerated:   °a. You may resume regular (light) daily activities beginning the next day--such as daily self-care, walking, climbing stairs--gradually increasing activities as tolerated.  If you can walk 30 minutes without difficulty, it is safe to try more intense activity such as jogging, treadmill, bicycling, low-impact aerobics, swimming, etc. °b. Save the most intensive and strenuous activity for last such as sit-ups, heavy lifting, contact sports, etc  Refrain from any heavy lifting or straining until you   are off narcotics for pain control.   °c. DO NOT PUSH THROUGH PAIN.  Let pain be your guide: If it hurts to do something, don't do it.  Pain is your body warning you to avoid that activity for another week until the pain goes down. °d. You may drive when you are no longer taking prescription pain medication, you can comfortably wear a seatbelt, and you can safely maneuver your car and  apply brakes. °e. You may have sexual intercourse when it is comfortable.  °8. FOLLOW UP in our office °a. Please call [facility] at [phone] to set up an appointment to see your surgeon in the office for a follow-up appointment approximately 2-3 weeks after your surgery. °b. Make sure that you call for this appointment the day you arrive home to insure a convenient appointment time. °9. IF YOU HAVE DISABILITY OR FAMILY LEAVE FORMS, BRING THEM TO THE OFFICE FOR PROCESSING.  DO NOT GIVE THEM TO YOUR DOCTOR. ° ° °WHEN TO CALL US [phone]: °1. Poor pain control °2. Reactions / problems with new medications (rash/itching, nausea, etc)  °3. Fever over 101.5 F (38.5 C) °4. Worsening swelling or bruising °5. Continued bleeding from incision. °6. Increased pain, redness, or drainage from the incision °7. Difficulty breathing / swallowing ° ° The clinic staff is available to answer your questions during regular business hours (8:30am-5pm).  Please don’t hesitate to call and ask to speak to one of our nurses for clinical concerns.  ° If you have a medical emergency, go to the nearest emergency room or call 911. ° A surgeon from [facility] is always on call at the hospitals ° ° °[facility] °[address], [address], [place], [place]  [address] ? °MAIN: [phone] ? TOLL FREE: 1-800-359-8415 ?  °FAX [phone] °[email or web address] ° ° °Post Anesthesia Home Care Instructions ° °Activity: °Get plenty of rest for the remainder of the day. A responsible adult should stay with you for 24 hours following the procedure.  °For the next 24 hours, DO NOT: °-Drive a car °-Operate machinery °-Drink alcoholic beverages °-Take any medication unless instructed by your physician °-Make any legal decisions or sign important papers. ° °Meals: °Start with liquid foods such as gelatin or soup. Progress to regular foods as tolerated. Avoid greasy, spicy, heavy foods. If nausea and/or  vomiting occur, drink only clear liquids until the nausea and/or vomiting subsides. Call your physician if vomiting continues. ° °Special Instructions/Symptoms: °Your throat may feel dry or sore from the anesthesia or the breathing tube placed in your throat during surgery. If this causes discomfort, gargle with warm salt water. The discomfort should disappear within 24 hours. ° °

## 2013-07-18 NOTE — Anesthesia Preprocedure Evaluation (Signed)
Anesthesia Evaluation  Patient identified by MRN, date of birth, ID band Patient awake    Reviewed: Allergy & Precautions, H&P , NPO status , Patient's Chart, lab work & pertinent test results, reviewed documented beta blocker date and time   Airway Mallampati: II TM Distance: >3 FB Neck ROM: Full    Dental no notable dental hx. (+) Teeth Intact, Dental Advisory Given   Pulmonary neg pulmonary ROS,  breath sounds clear to auscultation  Pulmonary exam normal       Cardiovascular hypertension, On Medications and On Home Beta Blockers Rhythm:Regular Rate:Normal     Neuro/Psych negative neurological ROS  negative psych ROS   GI/Hepatic negative GI ROS, Neg liver ROS,   Endo/Other  Hypothyroidism   Renal/GU negative Renal ROS  negative genitourinary   Musculoskeletal   Abdominal   Peds  Hematology negative hematology ROS (+)   Anesthesia Other Findings   Reproductive/Obstetrics negative OB ROS                           Anesthesia Physical Anesthesia Plan  ASA: II  Anesthesia Plan: General   Post-op Pain Management:    Induction: Intravenous  Airway Management Planned: LMA  Additional Equipment:   Intra-op Plan:   Post-operative Plan: Extubation in OR  Informed Consent: I have reviewed the patients History and Physical, chart, labs and discussed the procedure including the risks, benefits and alternatives for the proposed anesthesia with the patient or authorized representative who has indicated his/her understanding and acceptance.   Dental advisory given  Plan Discussed with: CRNA and Surgeon  Anesthesia Plan Comments:         Anesthesia Quick Evaluation

## 2013-07-19 ENCOUNTER — Telehealth (INDEPENDENT_AMBULATORY_CARE_PROVIDER_SITE_OTHER): Payer: Self-pay | Admitting: General Surgery

## 2013-07-19 ENCOUNTER — Encounter (HOSPITAL_BASED_OUTPATIENT_CLINIC_OR_DEPARTMENT_OTHER): Payer: Self-pay | Admitting: Surgery

## 2013-07-19 NOTE — Op Note (Signed)
NAMEOtho Perl NO.:  [id number]  MEDICAL RECORD NO.:  [id number]  LOCATION:                                 FACILITY:  PHYSICIAN:  Mathew Postiglione A. Jule Schlabach, M.D.     DATE OF BIRTH:  DATE OF PROCEDURE:  07/18/2013 DATE OF DISCHARGE:                              OPERATIVE REPORT   PREOPERATIVE DIAGNOSIS:  Painful 1 cm x 1 cm mass, anterior scalp.  POSTOPERATIVE DIAGNOSIS:  Painful 1 cm x 1 cm mass, anterior scalp.  PROCEDURE:  Excision of 1 cm x 1 cm scalp mass located in the skin.  SURGEON:  Marcello Moores A. Sonji Starkes, M.D.  ANESTHESIA:  General with 4 mL of 0.25% Marcaine with epinephrine.  EBL:  Minimal.  SPECIMEN:  Please see above.  DRAINS:  None.  INDICATIONS FOR PROCEDURE:  The patient is 80 year old female who has a painful cyst in her scalp.  She has had this for some time and it appeared to be a sebaceous cyst on examination.  We discussed options of observation nonoperative to treat this versus excision.  I told her it did not have to be excised, but she was adamant about having it removed. I discussed the risks of the procedure to include, but not exclusive of above bleeding, infection, recurrence, injury to neighboring structures, anesthesia side effects, medication side effects, and the need for other operative procedures.  We discussed observation, which I felt to be same as well, but again she was adamant because when she gets her hair done and gets clot and causes discomfort, but rather draining either.  After lengthy discussion of all the potential risks and alternative treatments, she wished to proceed with excision.  DESCRIPTION OF PROCEDURE:  The patient was met in the holding area with her husband and questions were answered.  She was taken back to the operating room and placed supine.  After induction of general anesthesia, the lesion was located in the midline of her scalp, which was anterior.  This was then wetted with chlorhexidine  soap that pushed the hair down.  Smaller area was shaved to gain access to the cyst.  The patient was aware her hair would be cut preoperatively.  We then prepped and draped the area in sterile fashion and time-out was done.  She received 300 mg of clindamycin.  Curvilinear incisions were made around the cyst structure.  We took this all the way down below the skin into the subcutaneous tissues of the scalp.  We removed the cyst intact.  We used significant amount of cautery since the scalp blood from that. This was controlled.  I then closed it with a deep layer.  I then mobilized the skin to close the defects since it was noted to be difficult to close it without mobilizing the skin off the underlying tissues with cautery for about a centimeter circumferentially.  We then closed this with 0 Vicryl.  Dermabond applied.  All final counts of sponge, needle, and instruments were found to be correct at this portion of the case.  The patient was then extubated, taken to recovery in satisfactory condition.     Gildardo Tickner A. Sabre Romberger,  M.D.     TAC/MEDQ  D:  07/18/2013  T:  07/19/2013  Job:  [id number]

## 2013-07-19 NOTE — Telephone Encounter (Signed)
Pt's husband called to report all is well on POD 1 except she still has lower that normal temperature (96.19F.)  Incision is clean and dry, not needing any pain meds at all.  Reassured husband and he will call back for any elevation of temperature or symptoms of infection.

## 2013-07-23 ENCOUNTER — Inpatient Hospital Stay (HOSPITAL_COMMUNITY): Admission: RE | Admit: 2013-07-23 | Payer: Medicare Other | Source: Ambulatory Visit

## 2013-07-25 DIAGNOSIS — M810 Age-related osteoporosis without current pathological fracture: Secondary | ICD-10-CM | POA: Diagnosis not present

## 2013-07-25 DIAGNOSIS — Z79899 Other long term (current) drug therapy: Secondary | ICD-10-CM | POA: Diagnosis not present

## 2013-07-26 ENCOUNTER — Telehealth (INDEPENDENT_AMBULATORY_CARE_PROVIDER_SITE_OTHER): Payer: Self-pay

## 2013-07-26 NOTE — Telephone Encounter (Signed)
LMOM> path benign

## 2013-07-26 NOTE — Telephone Encounter (Signed)
Message copied by Carlene Coria on Fri Jul 26, 2013  2:21 PM ------      Message from: Erroll Luna A      Created: Mon Jul 22, 2013  6:11 PM       b9 ------

## 2013-08-02 ENCOUNTER — Encounter (INDEPENDENT_AMBULATORY_CARE_PROVIDER_SITE_OTHER): Payer: Medicare Other | Admitting: Surgery

## 2013-08-06 DIAGNOSIS — L819 Disorder of pigmentation, unspecified: Secondary | ICD-10-CM | POA: Diagnosis not present

## 2013-08-06 DIAGNOSIS — L820 Inflamed seborrheic keratosis: Secondary | ICD-10-CM | POA: Diagnosis not present

## 2013-08-06 DIAGNOSIS — L739 Follicular disorder, unspecified: Secondary | ICD-10-CM | POA: Diagnosis not present

## 2013-08-07 DIAGNOSIS — J040 Acute laryngitis: Secondary | ICD-10-CM | POA: Diagnosis not present

## 2013-08-13 ENCOUNTER — Encounter (INDEPENDENT_AMBULATORY_CARE_PROVIDER_SITE_OTHER): Payer: Self-pay | Admitting: Surgery

## 2013-08-13 ENCOUNTER — Ambulatory Visit (INDEPENDENT_AMBULATORY_CARE_PROVIDER_SITE_OTHER): Payer: Medicare Other | Admitting: Surgery

## 2013-08-13 VITALS — BP 116/70 | HR 56 | Temp 97.9°F | Resp 14 | Ht 65.0 in | Wt 127.6 lb

## 2013-08-13 DIAGNOSIS — Z9889 Other specified postprocedural states: Secondary | ICD-10-CM

## 2013-08-13 NOTE — Progress Notes (Signed)
Pt returns 4 weeks after excision of cyst on scalp.  She is doing well.  Wants glue to come off faster and get hair colored.   Exam:  scalp incision with glue on it.  No signs of redness,  Discharge or infection.   Skin , mass of scalp - PILOMATRICOMA.    A/P 4 weeks s/p excision of scalp  cyst.  Return as needed.  Can remove glue.  OK to color hair.

## 2013-08-13 NOTE — Patient Instructions (Signed)
Ok to color hair.  Can use acetone to help soften glue.  OK to remove and color hair. Return as needed.

## 2013-08-14 DIAGNOSIS — J069 Acute upper respiratory infection, unspecified: Secondary | ICD-10-CM

## 2013-08-14 HISTORY — DX: Acute upper respiratory infection, unspecified: J06.9

## 2013-08-20 ENCOUNTER — Ambulatory Visit (HOSPITAL_COMMUNITY)
Admission: RE | Admit: 2013-08-20 | Discharge: 2013-08-20 | Disposition: A | Discharge status: Home / Self Care | Payer: Medicare Other | Source: Ambulatory Visit | Attending: Internal Medicine | Admitting: Internal Medicine

## 2013-08-20 ENCOUNTER — Encounter (HOSPITAL_COMMUNITY): Payer: Self-pay

## 2013-08-20 ENCOUNTER — Other Ambulatory Visit (HOSPITAL_COMMUNITY): Payer: Self-pay | Admitting: Internal Medicine

## 2013-08-20 DIAGNOSIS — M810 Age-related osteoporosis without current pathological fracture: Secondary | ICD-10-CM | POA: Diagnosis not present

## 2013-08-20 HISTORY — DX: Acute upper respiratory infection, unspecified: J06.9

## 2013-08-20 MED ORDER — SODIUM CHLORIDE 0.9 % IV SOLN
Freq: Once | INTRAVENOUS | Status: AC
  Administered 2013-08-20: 14:00:00 via INTRAVENOUS

## 2013-08-20 MED ORDER — ZOLEDRONIC ACID 5 MG/100ML IV SOLN
5.0000 mg | Freq: Once | INTRAVENOUS | Status: AC
  Administered 2013-08-20: 5 mg via INTRAVENOUS
  Filled 2013-08-20: qty 100

## 2013-08-20 NOTE — Discharge Instructions (Signed)
Reclast - Zoledronic Acid injection (Paget's Disease, Osteoporosis) °What is this medicine? °ZOLEDRONIC ACID (ZOE le dron ik AS id) lowers the amount of calcium loss from bone. It is used to treat Paget's disease and osteoporosis in women. °This medicine may be used for other purposes; ask your health care provider or pharmacist if you have questions. °COMMON BRAND NAME(S): Reclast, Zometa °What should I tell my health care provider before I take this medicine? °They need to know if you have any of these conditions: °-aspirin-sensitive asthma °-cancer, especially if you are receiving medicines used to treat cancer °-dental disease or wear dentures °-infection °-kidney disease °-low levels of calcium in the blood °-past surgery on the parathyroid gland or intestines °-receiving corticosteroids like dexamethasone or prednisone °-an unusual or allergic reaction to zoledronic acid, other medicines, foods, dyes, or preservatives °-pregnant or trying to get pregnant °-breast-feeding °How should I use this medicine? °This medicine is for infusion into a vein. It is given by a health care professional in a hospital or clinic setting. °Talk to your pediatrician regarding the use of this medicine in children. This medicine is not approved for use in children. °Overdosage: If you think you have taken too much of this medicine contact a poison control center or emergency room at once. °NOTE: This medicine is only for you. Do not share this medicine with others. °What if I miss a dose? °It is important not to miss your dose. Call your doctor or health care professional if you are unable to keep an appointment. °What may interact with this medicine? °-certain antibiotics given by injection °-NSAIDs, medicines for pain and inflammation, like ibuprofen or naproxen °-some diuretics like bumetanide, furosemide °-teriparatide °This list may not describe all possible interactions. Give your health care provider a list of all the  medicines, herbs, non-prescription drugs, or dietary supplements you use. Also tell them if you smoke, drink alcohol, or use illegal drugs. Some items may interact with your medicine. °What should I watch for while using this medicine? °Visit your doctor or health care professional for regular checkups. It may be some time before you see the benefit from this medicine. Do not stop taking your medicine unless your doctor tells you to. Your doctor may order blood tests or other tests to see how you are doing. °Women should inform their doctor if they wish to become pregnant or think they might be pregnant. There is a potential for serious side effects to an unborn child. Talk to your health care professional or pharmacist for more information. °You should make sure that you get enough calcium and vitamin D while you are taking this medicine. Discuss the foods you eat and the vitamins you take with your health care professional. °Some people who take this medicine have severe bone, joint, and/or muscle pain. This medicine may also increase your risk for jaw problems or a broken thigh bone. Tell your doctor right away if you have severe pain in your jaw, bones, joints, or muscles. Tell your doctor if you have any pain that does not go away or that gets worse. °Tell your dentist and dental surgeon that you are taking this medicine. You should not have major dental surgery while on this medicine. See your dentist to have a dental exam and fix any dental problems before starting this medicine. Take good care of your teeth while on this medicine. Make sure you see your dentist for regular follow-up appointments. °What side effects may I notice from receiving   this medicine? °Side effects that you should report to your doctor or health care professional as soon as possible: °-allergic reactions like skin rash, itching or hives, swelling of the face, lips, or tongue °-anxiety, confusion, or depression °-breathing  problems °-changes in vision °-eye pain °-feeling faint or lightheaded, falls °-jaw pain, especially after dental work °-mouth sores °-muscle cramps, stiffness, or weakness °-trouble passing urine or change in the amount of urine °Side effects that usually do not require medical attention (report to your doctor or health care professional if they continue or are bothersome): °-bone, joint, or muscle pain °-constipation °-diarrhea °-fever °-hair loss °-irritation at site where injected °-loss of appetite °-nausea, vomiting °-stomach upset °-trouble sleeping °-trouble swallowing °-weak or tired °This list may not describe all possible side effects. Call your doctor for medical advice about side effects. You may report side effects to FDA at 1-800-FDA-1088. °Where should I keep my medicine? °This drug is given in a hospital or clinic and will not be stored at home. °NOTE: This sheet is a summary. It may not cover all possible information. If you have questions about this medicine, talk to your doctor, pharmacist, or health care provider. °© 2014, [employer]. (2012-08-20 10:03:48) ° °

## 2013-09-09 DIAGNOSIS — E039 Hypothyroidism, unspecified: Secondary | ICD-10-CM | POA: Diagnosis not present

## 2013-10-16 DIAGNOSIS — H6120 Impacted cerumen, unspecified ear: Secondary | ICD-10-CM | POA: Diagnosis not present

## 2014-01-08 DIAGNOSIS — Z23 Encounter for immunization: Secondary | ICD-10-CM | POA: Diagnosis not present

## 2014-01-21 DIAGNOSIS — H6123 Impacted cerumen, bilateral: Secondary | ICD-10-CM | POA: Diagnosis not present

## 2014-04-21 DIAGNOSIS — H6122 Impacted cerumen, left ear: Secondary | ICD-10-CM | POA: Diagnosis not present

## 2014-06-05 ENCOUNTER — Other Ambulatory Visit: Payer: Self-pay

## 2014-06-05 DIAGNOSIS — Z1231 Encounter for screening mammogram for malignant neoplasm of breast: Secondary | ICD-10-CM

## 2014-07-01 DIAGNOSIS — I10 Essential (primary) hypertension: Secondary | ICD-10-CM | POA: Diagnosis not present

## 2014-07-01 DIAGNOSIS — M810 Age-related osteoporosis without current pathological fracture: Secondary | ICD-10-CM | POA: Diagnosis not present

## 2014-07-01 DIAGNOSIS — E039 Hypothyroidism, unspecified: Secondary | ICD-10-CM | POA: Diagnosis not present

## 2014-07-01 DIAGNOSIS — E785 Hyperlipidemia, unspecified: Secondary | ICD-10-CM | POA: Diagnosis not present

## 2014-07-08 DIAGNOSIS — E785 Hyperlipidemia, unspecified: Secondary | ICD-10-CM | POA: Diagnosis not present

## 2014-07-08 DIAGNOSIS — K5900 Constipation, unspecified: Secondary | ICD-10-CM | POA: Diagnosis not present

## 2014-07-08 DIAGNOSIS — M5380 Other specified dorsopathies, site unspecified: Secondary | ICD-10-CM | POA: Diagnosis not present

## 2014-07-08 DIAGNOSIS — E039 Hypothyroidism, unspecified: Secondary | ICD-10-CM | POA: Diagnosis not present

## 2014-07-08 DIAGNOSIS — Z23 Encounter for immunization: Secondary | ICD-10-CM | POA: Diagnosis not present

## 2014-07-08 DIAGNOSIS — I10 Essential (primary) hypertension: Secondary | ICD-10-CM | POA: Diagnosis not present

## 2014-07-08 DIAGNOSIS — Z1389 Encounter for screening for other disorder: Secondary | ICD-10-CM | POA: Diagnosis not present

## 2014-07-08 DIAGNOSIS — I872 Venous insufficiency (chronic) (peripheral): Secondary | ICD-10-CM | POA: Diagnosis not present

## 2014-07-08 DIAGNOSIS — Z0000 Encounter for general adult medical examination without abnormal findings: Secondary | ICD-10-CM | POA: Diagnosis not present

## 2014-07-08 DIAGNOSIS — Z6821 Body mass index (BMI) 21.0-21.9, adult: Secondary | ICD-10-CM | POA: Diagnosis not present

## 2014-07-08 DIAGNOSIS — M810 Age-related osteoporosis without current pathological fracture: Secondary | ICD-10-CM | POA: Diagnosis not present

## 2014-07-08 DIAGNOSIS — L723 Sebaceous cyst: Secondary | ICD-10-CM | POA: Diagnosis not present

## 2014-07-10 ENCOUNTER — Other Ambulatory Visit: Payer: Self-pay

## 2014-07-10 ENCOUNTER — Ambulatory Visit
Admission: RE | Admit: 2014-07-10 | Discharge: 2014-07-10 | Disposition: A | Discharge status: Home / Self Care | Payer: Medicare Other | Source: Ambulatory Visit

## 2014-07-10 DIAGNOSIS — Z1231 Encounter for screening mammogram for malignant neoplasm of breast: Secondary | ICD-10-CM

## 2014-07-24 DIAGNOSIS — Z1212 Encounter for screening for malignant neoplasm of rectum: Secondary | ICD-10-CM | POA: Diagnosis not present

## 2014-07-29 DIAGNOSIS — M810 Age-related osteoporosis without current pathological fracture: Secondary | ICD-10-CM | POA: Diagnosis not present

## 2014-07-29 DIAGNOSIS — Z6821 Body mass index (BMI) 21.0-21.9, adult: Secondary | ICD-10-CM | POA: Diagnosis not present

## 2014-07-29 DIAGNOSIS — Z79899 Other long term (current) drug therapy: Secondary | ICD-10-CM | POA: Diagnosis not present

## 2014-08-19 DIAGNOSIS — L738 Other specified follicular disorders: Secondary | ICD-10-CM | POA: Diagnosis not present

## 2014-08-19 DIAGNOSIS — L821 Other seborrheic keratosis: Secondary | ICD-10-CM | POA: Diagnosis not present

## 2014-08-19 DIAGNOSIS — L814 Other melanin hyperpigmentation: Secondary | ICD-10-CM | POA: Diagnosis not present

## 2014-08-19 DIAGNOSIS — D1801 Hemangioma of skin and subcutaneous tissue: Secondary | ICD-10-CM | POA: Diagnosis not present

## 2014-08-21 DIAGNOSIS — H6122 Impacted cerumen, left ear: Secondary | ICD-10-CM | POA: Diagnosis not present

## 2014-08-26 DIAGNOSIS — Z961 Presence of intraocular lens: Secondary | ICD-10-CM | POA: Diagnosis not present

## 2014-08-28 ENCOUNTER — Other Ambulatory Visit (HOSPITAL_COMMUNITY): Payer: Self-pay | Admitting: Internal Medicine

## 2014-08-28 ENCOUNTER — Encounter (HOSPITAL_COMMUNITY): Payer: Self-pay

## 2014-08-28 ENCOUNTER — Ambulatory Visit (HOSPITAL_COMMUNITY)
Admission: RE | Admit: 2014-08-28 | Discharge: 2014-08-28 | Disposition: A | Discharge status: Home / Self Care | Payer: Medicare Other | Source: Ambulatory Visit | Attending: Internal Medicine | Admitting: Internal Medicine

## 2014-08-28 DIAGNOSIS — M810 Age-related osteoporosis without current pathological fracture: Secondary | ICD-10-CM | POA: Diagnosis not present

## 2014-08-28 HISTORY — DX: Other specified disorders of bone density and structure, unspecified site: M85.80

## 2014-08-28 MED ORDER — SODIUM CHLORIDE 0.9 % IV SOLN
Freq: Once | INTRAVENOUS | Status: AC
  Administered 2014-08-28: 250 mL via INTRAVENOUS

## 2014-08-28 MED ORDER — ZOLEDRONIC ACID 5 MG/100ML IV SOLN
5.0000 mg | Freq: Once | INTRAVENOUS | Status: AC
  Administered 2014-08-28: 5 mg via INTRAVENOUS
  Filled 2014-08-28: qty 100

## 2014-08-28 NOTE — Discharge Instructions (Signed)
RECLAST °Zoledronic Acid injection (Paget's Disease, Osteoporosis) °What is this medicine? °ZOLEDRONIC ACID (ZOE le dron ik AS id) lowers the amount of calcium loss from bone. It is used to treat Paget's disease and osteoporosis in women. °This medicine may be used for other purposes; ask your health care provider or pharmacist if you have questions. °COMMON BRAND NAME(S): Reclast, Zometa °What should I tell my health care provider before I take this medicine? °They need to know if you have any of these conditions: °-aspirin-sensitive asthma °-cancer, especially if you are receiving medicines used to treat cancer °-dental disease or wear dentures °-infection °-kidney disease °-low levels of calcium in the blood °-past surgery on the parathyroid gland or intestines °-receiving corticosteroids like dexamethasone or prednisone °-an unusual or allergic reaction to zoledronic acid, other medicines, foods, dyes, or preservatives °-pregnant or trying to get pregnant °-breast-feeding °How should I use this medicine? °This medicine is for infusion into a vein. It is given by a health care professional in a hospital or clinic setting. °Talk to your pediatrician regarding the use of this medicine in children. This medicine is not approved for use in children. °Overdosage: If you think you have taken too much of this medicine contact a poison control center or emergency room at once. °NOTE: This medicine is only for you. Do not share this medicine with others. °What if I miss a dose? °It is important not to miss your dose. Call your doctor or health care professional if you are unable to keep an appointment. °What may interact with this medicine? °-certain antibiotics given by injection °-NSAIDs, medicines for pain and inflammation, like ibuprofen or naproxen °-some diuretics like bumetanide, furosemide °-teriparatide °This list may not describe all possible interactions. Give your health care provider a list of all the  medicines, herbs, non-prescription drugs, or dietary supplements you use. Also tell them if you smoke, drink alcohol, or use illegal drugs. Some items may interact with your medicine. °What should I watch for while using this medicine? °Visit your doctor or health care professional for regular checkups. It may be some time before you see the benefit from this medicine. Do not stop taking your medicine unless your doctor tells you to. Your doctor may order blood tests or other tests to see how you are doing. °Women should inform their doctor if they wish to become pregnant or think they might be pregnant. There is a potential for serious side effects to an unborn child. Talk to your health care professional or pharmacist for more information. °You should make sure that you get enough calcium and vitamin D while you are taking this medicine. Discuss the foods you eat and the vitamins you take with your health care professional. °Some people who take this medicine have severe bone, joint, and/or muscle pain. This medicine may also increase your risk for jaw problems or a broken thigh bone. Tell your doctor right away if you have severe pain in your jaw, bones, joints, or muscles. Tell your doctor if you have any pain that does not go away or that gets worse. °Tell your dentist and dental surgeon that you are taking this medicine. You should not have major dental surgery while on this medicine. See your dentist to have a dental exam and fix any dental problems before starting this medicine. Take good care of your teeth while on this medicine. Make sure you see your dentist for regular follow-up appointments. °What side effects may I notice from receiving this   medicine? Side effects that you should report to your doctor or health care professional as soon as possible: -allergic reactions like skin rash, itching or hives, swelling of the face, lips, or tongue -anxiety, confusion, or depression -breathing  problems -changes in vision -eye pain -feeling faint or lightheaded, falls -jaw pain, especially after dental work -mouth sores -muscle cramps, stiffness, or weakness -trouble passing urine or change in the amount of urine Side effects that usually do not require medical attention (report to your doctor or health care professional if they continue or are bothersome): -bone, joint, or muscle pain -constipation -diarrhea -fever -hair loss -irritation at site where injected -loss of appetite -nausea, vomiting -stomach upset -trouble sleeping -trouble swallowing -weak or tired This list may not describe all possible side effects. Call your doctor for medical advice about side effects. You may report side effects to FDA at 1-800-FDA-1088. Where should I keep my medicine? This drug is given in a hospital or clinic and will not be stored at home. NOTE: This sheet is a summary. It may not cover all possible information. If you have questions about this medicine, talk to your doctor, pharmacist, or health care provider.  2015, [employer]. (2012-08-20 10:03:48)

## 2014-08-28 NOTE — Progress Notes (Signed)
Uneventful infusion of Reclast. Pt discharged ambulatory accompanied by husband to main lobby

## 2014-09-02 DIAGNOSIS — M25562 Pain in left knee: Secondary | ICD-10-CM | POA: Diagnosis not present

## 2014-09-02 DIAGNOSIS — M1712 Unilateral primary osteoarthritis, left knee: Secondary | ICD-10-CM | POA: Diagnosis not present

## 2014-09-09 DIAGNOSIS — I10 Essential (primary) hypertension: Secondary | ICD-10-CM | POA: Diagnosis not present

## 2014-09-09 DIAGNOSIS — Z6821 Body mass index (BMI) 21.0-21.9, adult: Secondary | ICD-10-CM | POA: Diagnosis not present

## 2014-09-18 ENCOUNTER — Encounter (HOSPITAL_COMMUNITY): Payer: Medicare Other

## 2014-10-07 DIAGNOSIS — M1712 Unilateral primary osteoarthritis, left knee: Secondary | ICD-10-CM | POA: Diagnosis not present

## 2014-10-09 DIAGNOSIS — Z124 Encounter for screening for malignant neoplasm of cervix: Secondary | ICD-10-CM | POA: Diagnosis not present

## 2014-10-09 DIAGNOSIS — N952 Postmenopausal atrophic vaginitis: Secondary | ICD-10-CM | POA: Diagnosis not present

## 2014-10-14 DIAGNOSIS — M1712 Unilateral primary osteoarthritis, left knee: Secondary | ICD-10-CM | POA: Diagnosis not present

## 2014-10-21 DIAGNOSIS — M1712 Unilateral primary osteoarthritis, left knee: Secondary | ICD-10-CM | POA: Diagnosis not present

## 2014-10-28 DIAGNOSIS — M25562 Pain in left knee: Secondary | ICD-10-CM | POA: Diagnosis not present

## 2014-10-28 DIAGNOSIS — M1712 Unilateral primary osteoarthritis, left knee: Secondary | ICD-10-CM | POA: Diagnosis not present

## 2014-11-04 DIAGNOSIS — M1712 Unilateral primary osteoarthritis, left knee: Secondary | ICD-10-CM | POA: Diagnosis not present

## 2014-12-17 DIAGNOSIS — H6122 Impacted cerumen, left ear: Secondary | ICD-10-CM | POA: Diagnosis not present

## 2015-01-05 DIAGNOSIS — M2011 Hallux valgus (acquired), right foot: Secondary | ICD-10-CM | POA: Diagnosis not present

## 2015-01-05 DIAGNOSIS — L600 Ingrowing nail: Secondary | ICD-10-CM | POA: Diagnosis not present

## 2015-01-05 DIAGNOSIS — M79672 Pain in left foot: Secondary | ICD-10-CM | POA: Diagnosis not present

## 2015-01-05 DIAGNOSIS — M2012 Hallux valgus (acquired), left foot: Secondary | ICD-10-CM | POA: Diagnosis not present

## 2015-01-05 DIAGNOSIS — M79671 Pain in right foot: Secondary | ICD-10-CM | POA: Diagnosis not present

## 2015-01-22 DIAGNOSIS — E785 Hyperlipidemia, unspecified: Secondary | ICD-10-CM | POA: Diagnosis not present

## 2015-01-22 DIAGNOSIS — J302 Other seasonal allergic rhinitis: Secondary | ICD-10-CM | POA: Diagnosis not present

## 2015-01-22 DIAGNOSIS — I10 Essential (primary) hypertension: Secondary | ICD-10-CM | POA: Diagnosis not present

## 2015-01-22 DIAGNOSIS — E039 Hypothyroidism, unspecified: Secondary | ICD-10-CM | POA: Diagnosis not present

## 2015-01-22 DIAGNOSIS — Z6821 Body mass index (BMI) 21.0-21.9, adult: Secondary | ICD-10-CM | POA: Diagnosis not present

## 2015-01-22 DIAGNOSIS — Z23 Encounter for immunization: Secondary | ICD-10-CM | POA: Diagnosis not present

## 2015-02-06 DIAGNOSIS — J040 Acute laryngitis: Secondary | ICD-10-CM | POA: Diagnosis not present

## 2015-02-06 DIAGNOSIS — J322 Chronic ethmoidal sinusitis: Secondary | ICD-10-CM | POA: Diagnosis not present

## 2015-02-06 DIAGNOSIS — H6122 Impacted cerumen, left ear: Secondary | ICD-10-CM | POA: Diagnosis not present

## 2015-02-06 DIAGNOSIS — J320 Chronic maxillary sinusitis: Secondary | ICD-10-CM | POA: Diagnosis not present

## 2015-03-21 ENCOUNTER — Ambulatory Visit (INDEPENDENT_AMBULATORY_CARE_PROVIDER_SITE_OTHER): Payer: Medicare Other

## 2015-03-21 ENCOUNTER — Ambulatory Visit (INDEPENDENT_AMBULATORY_CARE_PROVIDER_SITE_OTHER): Payer: Medicare Other | Admitting: Family Medicine

## 2015-03-21 VITALS — BP 130/86 | HR 66 | Temp 97.4°F | Resp 16 | Ht 64.5 in | Wt 125.0 lb

## 2015-03-21 DIAGNOSIS — M25551 Pain in right hip: Secondary | ICD-10-CM | POA: Diagnosis not present

## 2015-03-21 MED ORDER — HYDROCODONE-ACETAMINOPHEN 5-325 MG PO TABS: 1.0000 | ORAL_TABLET | Freq: Four times a day (QID) | ORAL | Status: DC | PRN

## 2015-03-21 NOTE — Patient Instructions (Signed)
Take the hydrocodone/acetaminophen 325/5 one pill every 4-6 hours as needed for pain  Alternate ice and heat packs  Try to see her orthopedic doctor sometime early in the week.  I prefer not to have to give you cortisone/prednisone type medications, though that might be the only other alternative short of the orthopedic doctor doing something.  Return or go to the emergency room if needed.

## 2015-03-21 NOTE — Progress Notes (Signed)
Patient ID: Shelley Cook, female    DOB: [birth year]  Age: 81 y.o. MRN: [id number]  Chief Complaint  Patient presents with  . Hip Pain    Subjective:   Patient is here with a two-day history of pain in her right hip. Knows of no injury. She has been pretty active cleaning up after Christmas. It started on Thursday, calm down, came back Friday, calm down enough for them to have guests. During the night it started hurting her and has been progressively worse today such as she has been crying with pain. She has had left total hip arthroplasty, but never had any major problems with the right side. She does have some history of lumbar disc disease, has had a laminectomy. The pain has been primarily right in the right hip, though sitting here waiting for me some is radiating down into the right posterior thigh.  Current allergies, medications, problem list, past/family and social histories reviewed.  Objective:  BP 130/86 mmHg  Pulse 66  Temp(Src) 97.4 F (36.3 C) (Oral)  Resp 16  Ht 5' 4.5" (1.638 m)  Wt 125 lb (56.7 kg)  BMI 21.13 kg/m2  SpO2 99%  Obviously having some pain. Spine is nontender to palpation or percussion. The hip is tender posteriorly on the right, deep in the mid buttock. She is nontender the right groin. Nontender on the femur. Rotation as she is seated in her chair does not seem to cause a lot of pain. Actually right this minute the pain seems to have subsided a little bit. It had been a crescendo pain prior to this.  Assessment & Plan:   Assessment: 1. Right hip pain       Plan: X-ray hip  Orders Placed This Encounter  Procedures  . DG HIP UNILAT WITH PELVIS 2-3 VIEWS RIGHT    Order Specific Question:  Reason for Exam (SYMPTOM  OR DIAGNOSIS REQUIRED)    Answer:  right hip pain, nki    Order Specific Question:  Preferred imaging location?    Answer:  External    Meds ordered this encounter  Medications  . acetaminophen (TYLENOL) 500 MG tablet    Sig:  Take 500 mg by mouth every 6 (six) hours as needed.    [facility] reading (PRIMARY) by  Dr. Linna Darner No acute bony injury. Arthritic changes are present.    There are no Patient Instructions on file for this visit.   No Follow-up on file.   Concha Sudol, MD 03/21/2015

## 2015-03-25 DIAGNOSIS — M4186 Other forms of scoliosis, lumbar region: Secondary | ICD-10-CM | POA: Diagnosis not present

## 2015-04-01 DIAGNOSIS — M4186 Other forms of scoliosis, lumbar region: Secondary | ICD-10-CM | POA: Diagnosis not present

## 2015-04-09 DIAGNOSIS — J302 Other seasonal allergic rhinitis: Secondary | ICD-10-CM | POA: Diagnosis not present

## 2015-04-09 DIAGNOSIS — Z6821 Body mass index (BMI) 21.0-21.9, adult: Secondary | ICD-10-CM | POA: Diagnosis not present

## 2015-04-09 DIAGNOSIS — M5380 Other specified dorsopathies, site unspecified: Secondary | ICD-10-CM | POA: Diagnosis not present

## 2015-04-09 DIAGNOSIS — R110 Nausea: Secondary | ICD-10-CM | POA: Diagnosis not present

## 2015-04-09 DIAGNOSIS — R05 Cough: Secondary | ICD-10-CM | POA: Diagnosis not present

## 2015-05-14 DIAGNOSIS — H6121 Impacted cerumen, right ear: Secondary | ICD-10-CM | POA: Diagnosis not present

## 2015-05-14 DIAGNOSIS — J301 Allergic rhinitis due to pollen: Secondary | ICD-10-CM | POA: Diagnosis not present

## 2015-06-12 DIAGNOSIS — R300 Dysuria: Secondary | ICD-10-CM | POA: Diagnosis not present

## 2015-07-07 ENCOUNTER — Other Ambulatory Visit: Payer: Self-pay

## 2015-07-07 DIAGNOSIS — Z1231 Encounter for screening mammogram for malignant neoplasm of breast: Secondary | ICD-10-CM

## 2015-07-22 ENCOUNTER — Ambulatory Visit
Admission: RE | Admit: 2015-07-22 | Discharge: 2015-07-22 | Disposition: A | Discharge status: Home / Self Care | Payer: Medicare Other | Source: Ambulatory Visit

## 2015-07-22 DIAGNOSIS — Z1231 Encounter for screening mammogram for malignant neoplasm of breast: Secondary | ICD-10-CM | POA: Diagnosis not present

## 2015-07-27 DIAGNOSIS — M810 Age-related osteoporosis without current pathological fracture: Secondary | ICD-10-CM | POA: Diagnosis not present

## 2015-07-27 DIAGNOSIS — R8299 Other abnormal findings in urine: Secondary | ICD-10-CM | POA: Diagnosis not present

## 2015-07-27 DIAGNOSIS — E038 Other specified hypothyroidism: Secondary | ICD-10-CM | POA: Diagnosis not present

## 2015-07-27 DIAGNOSIS — N390 Urinary tract infection, site not specified: Secondary | ICD-10-CM | POA: Diagnosis not present

## 2015-07-27 DIAGNOSIS — E784 Other hyperlipidemia: Secondary | ICD-10-CM | POA: Diagnosis not present

## 2015-07-27 DIAGNOSIS — I10 Essential (primary) hypertension: Secondary | ICD-10-CM | POA: Diagnosis not present

## 2015-08-03 DIAGNOSIS — Z0000 Encounter for general adult medical examination without abnormal findings: Secondary | ICD-10-CM | POA: Diagnosis not present

## 2015-08-03 DIAGNOSIS — E784 Other hyperlipidemia: Secondary | ICD-10-CM | POA: Diagnosis not present

## 2015-08-03 DIAGNOSIS — M810 Age-related osteoporosis without current pathological fracture: Secondary | ICD-10-CM | POA: Diagnosis not present

## 2015-08-03 DIAGNOSIS — J302 Other seasonal allergic rhinitis: Secondary | ICD-10-CM | POA: Diagnosis not present

## 2015-08-03 DIAGNOSIS — I10 Essential (primary) hypertension: Secondary | ICD-10-CM | POA: Diagnosis not present

## 2015-08-03 DIAGNOSIS — I872 Venous insufficiency (chronic) (peripheral): Secondary | ICD-10-CM | POA: Diagnosis not present

## 2015-08-03 DIAGNOSIS — Z1389 Encounter for screening for other disorder: Secondary | ICD-10-CM | POA: Diagnosis not present

## 2015-08-03 DIAGNOSIS — K5900 Constipation, unspecified: Secondary | ICD-10-CM | POA: Diagnosis not present

## 2015-08-03 DIAGNOSIS — Z6821 Body mass index (BMI) 21.0-21.9, adult: Secondary | ICD-10-CM | POA: Diagnosis not present

## 2015-08-03 DIAGNOSIS — N390 Urinary tract infection, site not specified: Secondary | ICD-10-CM | POA: Diagnosis not present

## 2015-08-03 DIAGNOSIS — M5380 Other specified dorsopathies, site unspecified: Secondary | ICD-10-CM | POA: Diagnosis not present

## 2015-08-07 DIAGNOSIS — H6062 Unspecified chronic otitis externa, left ear: Secondary | ICD-10-CM | POA: Diagnosis not present

## 2015-08-07 DIAGNOSIS — H6122 Impacted cerumen, left ear: Secondary | ICD-10-CM | POA: Diagnosis not present

## 2015-08-07 DIAGNOSIS — J301 Allergic rhinitis due to pollen: Secondary | ICD-10-CM | POA: Diagnosis not present

## 2015-08-13 DIAGNOSIS — H6062 Unspecified chronic otitis externa, left ear: Secondary | ICD-10-CM | POA: Diagnosis not present

## 2015-08-13 DIAGNOSIS — H6122 Impacted cerumen, left ear: Secondary | ICD-10-CM | POA: Diagnosis not present

## 2015-08-20 DIAGNOSIS — H6062 Unspecified chronic otitis externa, left ear: Secondary | ICD-10-CM | POA: Diagnosis not present

## 2015-08-25 DIAGNOSIS — D1801 Hemangioma of skin and subcutaneous tissue: Secondary | ICD-10-CM | POA: Diagnosis not present

## 2015-08-25 DIAGNOSIS — D692 Other nonthrombocytopenic purpura: Secondary | ICD-10-CM | POA: Diagnosis not present

## 2015-08-25 DIAGNOSIS — L814 Other melanin hyperpigmentation: Secondary | ICD-10-CM | POA: Diagnosis not present

## 2015-08-25 DIAGNOSIS — L821 Other seborrheic keratosis: Secondary | ICD-10-CM | POA: Diagnosis not present

## 2015-09-01 DIAGNOSIS — Z961 Presence of intraocular lens: Secondary | ICD-10-CM | POA: Diagnosis not present

## 2015-09-01 DIAGNOSIS — Z0100 Encounter for examination of eyes and vision without abnormal findings: Secondary | ICD-10-CM | POA: Diagnosis not present

## 2015-09-03 DIAGNOSIS — Z79899 Other long term (current) drug therapy: Secondary | ICD-10-CM | POA: Diagnosis not present

## 2015-09-03 DIAGNOSIS — E875 Hyperkalemia: Secondary | ICD-10-CM | POA: Diagnosis not present

## 2015-09-03 DIAGNOSIS — K5900 Constipation, unspecified: Secondary | ICD-10-CM | POA: Diagnosis not present

## 2015-09-03 DIAGNOSIS — M810 Age-related osteoporosis without current pathological fracture: Secondary | ICD-10-CM | POA: Diagnosis not present

## 2015-09-03 DIAGNOSIS — I10 Essential (primary) hypertension: Secondary | ICD-10-CM | POA: Diagnosis not present

## 2015-09-25 DIAGNOSIS — M810 Age-related osteoporosis without current pathological fracture: Secondary | ICD-10-CM | POA: Diagnosis not present

## 2015-09-25 DIAGNOSIS — E875 Hyperkalemia: Secondary | ICD-10-CM | POA: Diagnosis not present

## 2015-09-25 DIAGNOSIS — I10 Essential (primary) hypertension: Secondary | ICD-10-CM | POA: Diagnosis not present

## 2015-09-28 ENCOUNTER — Encounter (HOSPITAL_COMMUNITY): Payer: Medicare Other

## 2015-10-13 DIAGNOSIS — R350 Frequency of micturition: Secondary | ICD-10-CM | POA: Diagnosis not present

## 2015-10-13 DIAGNOSIS — N898 Other specified noninflammatory disorders of vagina: Secondary | ICD-10-CM | POA: Diagnosis not present

## 2015-10-14 ENCOUNTER — Other Ambulatory Visit: Payer: Self-pay | Admitting: Internal Medicine

## 2015-10-14 ENCOUNTER — Other Ambulatory Visit (HOSPITAL_COMMUNITY): Payer: Self-pay

## 2015-10-14 ENCOUNTER — Ambulatory Visit (HOSPITAL_COMMUNITY): Payer: Medicare Other | Attending: Cardiovascular Disease

## 2015-10-14 DIAGNOSIS — I119 Hypertensive heart disease without heart failure: Secondary | ICD-10-CM | POA: Insufficient documentation

## 2015-10-14 DIAGNOSIS — E785 Hyperlipidemia, unspecified: Secondary | ICD-10-CM | POA: Insufficient documentation

## 2015-10-14 DIAGNOSIS — I10 Essential (primary) hypertension: Secondary | ICD-10-CM | POA: Diagnosis not present

## 2015-10-14 DIAGNOSIS — I340 Nonrheumatic mitral (valve) insufficiency: Secondary | ICD-10-CM | POA: Diagnosis not present

## 2015-10-23 DIAGNOSIS — N952 Postmenopausal atrophic vaginitis: Secondary | ICD-10-CM | POA: Diagnosis not present

## 2015-10-23 DIAGNOSIS — N898 Other specified noninflammatory disorders of vagina: Secondary | ICD-10-CM | POA: Diagnosis not present

## 2015-10-23 DIAGNOSIS — N390 Urinary tract infection, site not specified: Secondary | ICD-10-CM | POA: Diagnosis not present

## 2015-11-24 DIAGNOSIS — H6122 Impacted cerumen, left ear: Secondary | ICD-10-CM | POA: Diagnosis not present

## 2015-11-27 DIAGNOSIS — M1712 Unilateral primary osteoarthritis, left knee: Secondary | ICD-10-CM | POA: Diagnosis not present

## 2015-12-02 ENCOUNTER — Encounter: Payer: Self-pay | Admitting: Interventional Cardiology

## 2015-12-03 DIAGNOSIS — M25562 Pain in left knee: Secondary | ICD-10-CM | POA: Diagnosis not present

## 2015-12-03 DIAGNOSIS — M1712 Unilateral primary osteoarthritis, left knee: Secondary | ICD-10-CM | POA: Diagnosis not present

## 2015-12-11 DIAGNOSIS — M1712 Unilateral primary osteoarthritis, left knee: Secondary | ICD-10-CM | POA: Diagnosis not present

## 2015-12-16 ENCOUNTER — Encounter (INDEPENDENT_AMBULATORY_CARE_PROVIDER_SITE_OTHER): Payer: Self-pay

## 2015-12-16 ENCOUNTER — Encounter: Payer: Self-pay | Admitting: Interventional Cardiology

## 2015-12-16 ENCOUNTER — Ambulatory Visit (INDEPENDENT_AMBULATORY_CARE_PROVIDER_SITE_OTHER): Payer: Medicare Other | Admitting: Interventional Cardiology

## 2015-12-16 VITALS — BP 140/75 | HR 50 | Ht 64.5 in | Wt 121.0 lb

## 2015-12-16 DIAGNOSIS — Z23 Encounter for immunization: Secondary | ICD-10-CM | POA: Diagnosis not present

## 2015-12-16 DIAGNOSIS — I059 Rheumatic mitral valve disease, unspecified: Secondary | ICD-10-CM | POA: Diagnosis not present

## 2015-12-16 DIAGNOSIS — I10 Essential (primary) hypertension: Secondary | ICD-10-CM

## 2015-12-16 DIAGNOSIS — I3481 Nonrheumatic mitral (valve) annulus calcification: Secondary | ICD-10-CM | POA: Insufficient documentation

## 2015-12-16 DIAGNOSIS — R001 Bradycardia, unspecified: Secondary | ICD-10-CM | POA: Insufficient documentation

## 2015-12-16 NOTE — Patient Instructions (Signed)
**Note De-identified  Obfuscation** Medication Instructions:  Same-no changes  Labwork: None  Testing/Procedures: None  Follow-Up: Your physician wants you to follow-up in: 1 year. You will receive a reminder letter in the mail two months in advance. If you don't receive a letter, please call our office to schedule the follow-up appointment.      If you need a refill on your cardiac medications before your next appointment, please call your pharmacy.   

## 2015-12-16 NOTE — Progress Notes (Signed)
Cardiology Office Note   Date:  12/16/2015   ID:  Shelley Cook, DOB [birth year], MRN [id number]  PCP:  Tivis Ringer, MD    No chief complaint on file.  Mitral Annular Calcification  Wt Readings from Last 3 Encounters:  12/16/15 121 lb (54.9 kg)  03/21/15 125 lb (56.7 kg)  08/28/14 125 lb (56.7 kg)       History of Present Illness: Shelley Cook is a 82 y.o. female  Who had some problems earlier in 2017 with shortness of breath.  This prompted an echocardiogram which showed severe MAC.  The [facility] has decreased since then as has the stress level in the house.  Her husband's health has improved.  No chest pain.  No leg swelling typically.  No problems lying flat.  She [place] the [place].  She goes up and down stairs without difficulty.  She has some joint pains that limit her. Overall, she is feeling better than she did several months ago.  She requests a flu shot today as well.   Past Medical History:  Diagnosis Date  . Arthritis   . High cholesterol   . Hypertension   . Hypothyroidism   . Osteopenia   . URI, acute 08-14-2013  . Wears glasses    read    Past Surgical History:  Procedure Laterality Date  . ABDOMINAL HYSTERECTOMY    . BACK SURGERY  2009   lumb lam  . DILATION AND CURETTAGE OF UTERUS    . EYE SURGERY     both cataracts 2013  . fractured femure  1988  . HERNIA REPAIR  2008   left ingunial  . hip replacement left replacement 2009  2009   left total hip  . left heel    . MASS EXCISION N/A 07/18/2013   Procedure: EXCISION SEBACEOUS CYST SCALP;  Surgeon: [name]. Cornett, MD;  Location: [facility];  Service: General;  Laterality: N/A;  . TONSILLECTOMY    . TUBAL LIGATION       Current Outpatient Prescriptions  Medication Sig Dispense Refill  . acetaminophen (TYLENOL) 500 MG tablet Take 500 mg by mouth every 6 (six) hours as needed for mild pain.     [name] amLODipine (NORVASC) 2.5 MG tablet Take 2.5 mg  by mouth daily.     [name] aspirin 81 MG tablet Take 81 mg by mouth daily.    . calcium-vitamin D (OSCAL WITH D) 500-200 MG-UNIT per tablet Take 1 tablet by mouth daily.    . carvedilol (COREG) 12.5 MG tablet Take 12.5 mg by mouth 2 (two) times daily with a meal.     . cloNIDine (CATAPRES) 0.1 MG tablet Take 0.1 mg by mouth 2 (two) times daily.    [name] ESTRACE VAGINAL 0.1 MG/GM vaginal cream Place 1 Applicatorful vaginally 2 (two) times a week. Reported on 03/21/2015    . fluticasone (FLONASE) 50 MCG/ACT nasal spray Place 1 spray into both nostrils as needed for allergies.     [name] levothyroxine (SYNTHROID, LEVOTHROID) 75 MCG tablet Take 75 mcg by mouth daily before breakfast.    . montelukast (SINGULAIR) 10 MG tablet Take 10 mg by mouth at bedtime.     . Multiple Vitamins-Minerals (MULTIVITAMIN WITH MINERALS) tablet Take 1 tablet by mouth daily.    [name] olmesartan (BENICAR) 20 MG tablet Take 20 mg by mouth daily.     . Probiotic Product (PROBIOTIC & ACIDOPHILUS EX ST PO) Take 1  tablet by mouth daily. Reported on 03/21/2015    . simvastatin (ZOCOR) 20 MG tablet Take 20 mg by mouth every evening.    . zoledronic acid (RECLAST) 5 MG/100ML SOLN injection Inject 5 mg into the vein once. Once yearly     No current facility-administered medications for this visit.     Allergies:   Celebrex [celecoxib]; Doxycycline; Hctz [hydrochlorothiazide]; Levaquin [levofloxacin in d5w]; Sudafed [pseudoephedrine hcl]; Aleve [naproxen sodium]; Asa [aspirin]; Augmentin [amoxicillin-pot clavulanate]; Ceftin [cefuroxime axetil]; Ibuprofen; Keflex [cephalexin]; Nitrofurantoin; Nsaids; Penicillins; Sulfa antibiotics; and Zithromax [azithromycin]    Social History:  The patient  reports that she has never smoked. She has never used smokeless tobacco. She reports that she does not drink alcohol or use drugs.   Family History:  The patient's family history includes Heart attack in her father.    ROS:  Please see the history of  present illness.   Otherwise, review of systems are positive for Joint pains.   All other systems are reviewed and negative.    PHYSICAL EXAM: VS:  BP 140/75   Pulse (!) 50   Ht 5' 4.5" (1.638 m)   Wt 121 lb (54.9 kg)   BMI 20.45 kg/m  , BMI Body mass index is 20.45 kg/m. GEN: Well nourished, well developed, in no acute distress  HEENT: normal  Neck: no JVD, carotid bruits, or masses Cardiac: RRR; 1 out of 6 early systolic murmurs, no rubs, or gallops,no edema  Respiratory:  clear to auscultation bilaterally, normal work of breathing GI: soft, nontender, nondistended, + BS MS: no deformity or atrophy  Skin: warm and dry, no rash Neuro:  Strength and sensation are intact Psych: euthymic mood, full affect   EKG:   The ekg ordered today demonstrates sinus bradycardia, poor R-wave progression   Recent Labs: No results found for requested labs within last 8760 hours.   Lipid Panel No results found for: CHOL, TRIG, HDL, CHOLHDL, VLDL, LDLCALC, LDLDIRECT   Other studies Reviewed: Additional studies/ records that were reviewed today with results demonstrating: Echocardiogram result reviewed. Normal left ventricular function. Severe mitral annular calcification without significant impairment of leaflet movement.   ASSESSMENT AND PLAN:  1. Mitral annular calcification: Despite the severe calcification, it appears her mitral valve adequately functions. No further management needed at this time. No indication for SBE prophylaxis.  2. Hypertension: Blood pressure well controlled at this time. She does report dry mouth which is likely a side effect of her clonidine. Will defer to her primary care doctor. Would suggest that her amlodipine and/or Benicar could be increased to maximum dose and then potentially her clonidine could be stopped. 3. Bradycardia: Carvedilol has been decreased. By increasing the amlodipine and/or Benicar, potentially could decrease that the dose further of  carvedilol which may help with bradycardia. 4. Flu shot given today.   Current medicines are reviewed at length with the patient today.  The patient concerns regarding her medicines were addressed.  The following changes have been made:  No change  Labs/ tests ordered today include:  No orders of the defined types were placed in this encounter.   Recommend 150 minutes/week of aerobic exercise Low fat, low carb, high fiber diet recommended  Disposition:   FU prn   Signed, [name], MD  12/16/2015 9:33 AM    [facility] [address], [place], [place]  [address] Phone: [phone]; Fax: [phone]

## 2015-12-18 DIAGNOSIS — M1712 Unilateral primary osteoarthritis, left knee: Secondary | ICD-10-CM | POA: Diagnosis not present

## 2015-12-25 DIAGNOSIS — M1712 Unilateral primary osteoarthritis, left knee: Secondary | ICD-10-CM | POA: Diagnosis not present

## 2016-01-28 DIAGNOSIS — Z124 Encounter for screening for malignant neoplasm of cervix: Secondary | ICD-10-CM | POA: Diagnosis not present

## 2016-01-28 DIAGNOSIS — N951 Menopausal and female climacteric states: Secondary | ICD-10-CM | POA: Diagnosis not present

## 2016-01-28 DIAGNOSIS — N952 Postmenopausal atrophic vaginitis: Secondary | ICD-10-CM | POA: Diagnosis not present

## 2016-02-03 DIAGNOSIS — I348 Other nonrheumatic mitral valve disorders: Secondary | ICD-10-CM | POA: Diagnosis not present

## 2016-02-03 DIAGNOSIS — I10 Essential (primary) hypertension: Secondary | ICD-10-CM | POA: Diagnosis not present

## 2016-02-03 DIAGNOSIS — E038 Other specified hypothyroidism: Secondary | ICD-10-CM | POA: Diagnosis not present

## 2016-02-03 DIAGNOSIS — Z6820 Body mass index (BMI) 20.0-20.9, adult: Secondary | ICD-10-CM | POA: Diagnosis not present

## 2016-02-03 DIAGNOSIS — E875 Hyperkalemia: Secondary | ICD-10-CM | POA: Diagnosis not present

## 2016-02-03 DIAGNOSIS — M810 Age-related osteoporosis without current pathological fracture: Secondary | ICD-10-CM | POA: Diagnosis not present

## 2016-02-18 DIAGNOSIS — H6062 Unspecified chronic otitis externa, left ear: Secondary | ICD-10-CM | POA: Diagnosis not present

## 2016-02-18 DIAGNOSIS — H6122 Impacted cerumen, left ear: Secondary | ICD-10-CM | POA: Diagnosis not present

## 2016-04-22 DIAGNOSIS — Z6820 Body mass index (BMI) 20.0-20.9, adult: Secondary | ICD-10-CM | POA: Diagnosis not present

## 2016-04-22 DIAGNOSIS — S70212A Abrasion, left hip, initial encounter: Secondary | ICD-10-CM | POA: Diagnosis not present

## 2016-04-25 DIAGNOSIS — S0502XA Injury of conjunctiva and corneal abrasion without foreign body, left eye, initial encounter: Secondary | ICD-10-CM | POA: Diagnosis not present

## 2016-05-24 DIAGNOSIS — H6122 Impacted cerumen, left ear: Secondary | ICD-10-CM | POA: Diagnosis not present

## 2016-06-29 ENCOUNTER — Other Ambulatory Visit: Payer: Self-pay | Admitting: Internal Medicine

## 2016-06-29 DIAGNOSIS — Z1231 Encounter for screening mammogram for malignant neoplasm of breast: Secondary | ICD-10-CM

## 2016-07-25 ENCOUNTER — Ambulatory Visit
Admission: RE | Admit: 2016-07-25 | Discharge: 2016-07-25 | Disposition: A | Discharge status: Home / Self Care | Payer: Medicare Other | Source: Ambulatory Visit | Attending: Internal Medicine | Admitting: Internal Medicine

## 2016-07-25 DIAGNOSIS — Z1231 Encounter for screening mammogram for malignant neoplasm of breast: Secondary | ICD-10-CM

## 2016-07-28 DIAGNOSIS — H6122 Impacted cerumen, left ear: Secondary | ICD-10-CM | POA: Diagnosis not present

## 2016-07-28 DIAGNOSIS — J301 Allergic rhinitis due to pollen: Secondary | ICD-10-CM | POA: Diagnosis not present

## 2016-08-04 DIAGNOSIS — E784 Other hyperlipidemia: Secondary | ICD-10-CM | POA: Diagnosis not present

## 2016-08-04 DIAGNOSIS — E038 Other specified hypothyroidism: Secondary | ICD-10-CM | POA: Diagnosis not present

## 2016-08-04 DIAGNOSIS — R8299 Other abnormal findings in urine: Secondary | ICD-10-CM | POA: Diagnosis not present

## 2016-08-04 DIAGNOSIS — M810 Age-related osteoporosis without current pathological fracture: Secondary | ICD-10-CM | POA: Diagnosis not present

## 2016-08-04 DIAGNOSIS — R358 Other polyuria: Secondary | ICD-10-CM | POA: Diagnosis not present

## 2016-08-04 DIAGNOSIS — I10 Essential (primary) hypertension: Secondary | ICD-10-CM | POA: Diagnosis not present

## 2016-08-11 DIAGNOSIS — Z0000 Encounter for general adult medical examination without abnormal findings: Secondary | ICD-10-CM | POA: Diagnosis not present

## 2016-08-11 DIAGNOSIS — I10 Essential (primary) hypertension: Secondary | ICD-10-CM | POA: Diagnosis not present

## 2016-08-11 DIAGNOSIS — K5900 Constipation, unspecified: Secondary | ICD-10-CM | POA: Diagnosis not present

## 2016-08-11 DIAGNOSIS — Z6820 Body mass index (BMI) 20.0-20.9, adult: Secondary | ICD-10-CM | POA: Diagnosis not present

## 2016-08-11 DIAGNOSIS — E038 Other specified hypothyroidism: Secondary | ICD-10-CM | POA: Diagnosis not present

## 2016-08-11 DIAGNOSIS — Z1389 Encounter for screening for other disorder: Secondary | ICD-10-CM | POA: Diagnosis not present

## 2016-08-11 DIAGNOSIS — M810 Age-related osteoporosis without current pathological fracture: Secondary | ICD-10-CM | POA: Diagnosis not present

## 2016-08-11 DIAGNOSIS — E784 Other hyperlipidemia: Secondary | ICD-10-CM | POA: Diagnosis not present

## 2016-08-11 DIAGNOSIS — K219 Gastro-esophageal reflux disease without esophagitis: Secondary | ICD-10-CM | POA: Diagnosis not present

## 2016-08-11 DIAGNOSIS — E875 Hyperkalemia: Secondary | ICD-10-CM | POA: Diagnosis not present

## 2016-08-11 DIAGNOSIS — I872 Venous insufficiency (chronic) (peripheral): Secondary | ICD-10-CM | POA: Diagnosis not present

## 2016-08-11 DIAGNOSIS — I348 Other nonrheumatic mitral valve disorders: Secondary | ICD-10-CM | POA: Diagnosis not present

## 2016-08-23 DIAGNOSIS — D692 Other nonthrombocytopenic purpura: Secondary | ICD-10-CM | POA: Diagnosis not present

## 2016-08-23 DIAGNOSIS — L738 Other specified follicular disorders: Secondary | ICD-10-CM | POA: Diagnosis not present

## 2016-08-23 DIAGNOSIS — L821 Other seborrheic keratosis: Secondary | ICD-10-CM | POA: Diagnosis not present

## 2016-08-23 DIAGNOSIS — D1801 Hemangioma of skin and subcutaneous tissue: Secondary | ICD-10-CM | POA: Diagnosis not present

## 2016-08-23 DIAGNOSIS — D2261 Melanocytic nevi of right upper limb, including shoulder: Secondary | ICD-10-CM | POA: Diagnosis not present

## 2016-09-26 DIAGNOSIS — M1712 Unilateral primary osteoarthritis, left knee: Secondary | ICD-10-CM | POA: Diagnosis not present

## 2016-10-03 DIAGNOSIS — M25562 Pain in left knee: Secondary | ICD-10-CM | POA: Diagnosis not present

## 2016-10-03 DIAGNOSIS — M1712 Unilateral primary osteoarthritis, left knee: Secondary | ICD-10-CM | POA: Diagnosis not present

## 2016-10-11 DIAGNOSIS — M1712 Unilateral primary osteoarthritis, left knee: Secondary | ICD-10-CM | POA: Diagnosis not present

## 2016-10-11 DIAGNOSIS — M2242 Chondromalacia patellae, left knee: Secondary | ICD-10-CM | POA: Diagnosis not present

## 2016-10-11 DIAGNOSIS — M25562 Pain in left knee: Secondary | ICD-10-CM | POA: Diagnosis not present

## 2016-10-17 DIAGNOSIS — M1712 Unilateral primary osteoarthritis, left knee: Secondary | ICD-10-CM | POA: Diagnosis not present

## 2016-10-24 DIAGNOSIS — M1712 Unilateral primary osteoarthritis, left knee: Secondary | ICD-10-CM | POA: Diagnosis not present

## 2016-10-24 DIAGNOSIS — M25562 Pain in left knee: Secondary | ICD-10-CM | POA: Diagnosis not present

## 2016-11-03 DIAGNOSIS — H6122 Impacted cerumen, left ear: Secondary | ICD-10-CM | POA: Diagnosis not present

## 2017-01-31 DIAGNOSIS — Z23 Encounter for immunization: Secondary | ICD-10-CM | POA: Diagnosis not present

## 2017-02-06 DIAGNOSIS — I10 Essential (primary) hypertension: Secondary | ICD-10-CM | POA: Diagnosis not present

## 2017-02-06 DIAGNOSIS — R55 Syncope and collapse: Secondary | ICD-10-CM | POA: Diagnosis not present

## 2017-02-06 DIAGNOSIS — I348 Other nonrheumatic mitral valve disorders: Secondary | ICD-10-CM | POA: Diagnosis not present

## 2017-02-06 DIAGNOSIS — Z681 Body mass index (BMI) 19 or less, adult: Secondary | ICD-10-CM | POA: Diagnosis not present

## 2017-02-07 ENCOUNTER — Telehealth: Payer: Self-pay

## 2017-02-07 NOTE — Telephone Encounter (Signed)
NOTES SENT TO SCHEDULING.  °

## 2017-02-12 ENCOUNTER — Encounter (HOSPITAL_COMMUNITY): Payer: Self-pay | Admitting: *Deleted

## 2017-02-12 ENCOUNTER — Emergency Department (HOSPITAL_COMMUNITY): Payer: Medicare Other

## 2017-02-12 ENCOUNTER — Emergency Department (HOSPITAL_COMMUNITY)
Admission: EM | Admit: 2017-02-12 | Discharge: 2017-02-12 | Disposition: A | Discharge status: Home / Self Care | Payer: Medicare Other | Attending: Emergency Medicine | Admitting: Emergency Medicine

## 2017-02-12 DIAGNOSIS — Y939 Activity, unspecified: Secondary | ICD-10-CM | POA: Diagnosis not present

## 2017-02-12 DIAGNOSIS — W19XXXA Unspecified fall, initial encounter: Secondary | ICD-10-CM | POA: Insufficient documentation

## 2017-02-12 DIAGNOSIS — S065X0A Traumatic subdural hemorrhage without loss of consciousness, initial encounter: Secondary | ICD-10-CM | POA: Diagnosis not present

## 2017-02-12 DIAGNOSIS — Y999 Unspecified external cause status: Secondary | ICD-10-CM | POA: Insufficient documentation

## 2017-02-12 DIAGNOSIS — E871 Hypo-osmolality and hyponatremia: Secondary | ICD-10-CM | POA: Diagnosis not present

## 2017-02-12 DIAGNOSIS — R030 Elevated blood-pressure reading, without diagnosis of hypertension: Secondary | ICD-10-CM | POA: Diagnosis not present

## 2017-02-12 DIAGNOSIS — Z79899 Other long term (current) drug therapy: Secondary | ICD-10-CM | POA: Diagnosis not present

## 2017-02-12 DIAGNOSIS — E039 Hypothyroidism, unspecified: Secondary | ICD-10-CM | POA: Insufficient documentation

## 2017-02-12 DIAGNOSIS — S065X9A Traumatic subdural hemorrhage with loss of consciousness of unspecified duration, initial encounter: Secondary | ICD-10-CM

## 2017-02-12 DIAGNOSIS — G4489 Other headache syndrome: Secondary | ICD-10-CM | POA: Diagnosis not present

## 2017-02-12 DIAGNOSIS — Y929 Unspecified place or not applicable: Secondary | ICD-10-CM | POA: Insufficient documentation

## 2017-02-12 DIAGNOSIS — I10 Essential (primary) hypertension: Secondary | ICD-10-CM | POA: Insufficient documentation

## 2017-02-12 DIAGNOSIS — R51 Headache: Secondary | ICD-10-CM | POA: Diagnosis not present

## 2017-02-12 DIAGNOSIS — R531 Weakness: Secondary | ICD-10-CM | POA: Diagnosis not present

## 2017-02-12 DIAGNOSIS — S065XAA Traumatic subdural hemorrhage with loss of consciousness status unknown, initial encounter: Secondary | ICD-10-CM

## 2017-02-12 DIAGNOSIS — R918 Other nonspecific abnormal finding of lung field: Secondary | ICD-10-CM | POA: Diagnosis not present

## 2017-02-12 DIAGNOSIS — S0990XA Unspecified injury of head, initial encounter: Secondary | ICD-10-CM | POA: Diagnosis not present

## 2017-02-12 LAB — CBC
HEMATOCRIT: 40.7 % (ref 36.0–46.0)
HEMOGLOBIN: 14.3 g/dL (ref 12.0–15.0)
MCH: 32.6 pg (ref 26.0–34.0)
MCHC: 35.1 g/dL (ref 30.0–36.0)
MCV: 92.7 fL (ref 78.0–100.0)
Platelets: 235 10*3/uL (ref 150–400)
RBC: 4.39 MIL/uL (ref 3.87–5.11)
RDW: 13 % (ref 11.5–15.5)
WBC: 7.8 10*3/uL (ref 4.0–10.5)

## 2017-02-12 LAB — URINALYSIS, ROUTINE W REFLEX MICROSCOPIC
Bilirubin Urine: NEGATIVE
GLUCOSE, UA: NEGATIVE mg/dL
Hgb urine dipstick: NEGATIVE
Ketones, ur: NEGATIVE mg/dL
LEUKOCYTES UA: NEGATIVE
Nitrite: NEGATIVE
PH: 7 (ref 5.0–8.0)
Protein, ur: NEGATIVE mg/dL
Specific Gravity, Urine: 1.005 (ref 1.005–1.030)

## 2017-02-12 LAB — BASIC METABOLIC PANEL
Anion gap: 9 (ref 5–15)
BUN: 16 mg/dL (ref 6–20)
CHLORIDE: 93 mmol/L — AB (ref 101–111)
CO2: 27 mmol/L (ref 22–32)
Calcium: 9.3 mg/dL (ref 8.9–10.3)
Creatinine, Ser: 0.77 mg/dL (ref 0.44–1.00)
GFR calc non Af Amer: >60 mL/min (ref >60)
Glucose, Bld: 114 mg/dL — ABNORMAL HIGH (ref 65–99)
POTASSIUM: 4.1 mmol/L (ref 3.5–5.1)
SODIUM: 129 mmol/L — AB (ref 135–145)

## 2017-02-12 LAB — CBG MONITORING, ED: Glucose-Capillary: 107 mg/dL — ABNORMAL HIGH (ref 65–99)

## 2017-02-12 MED ORDER — SODIUM CHLORIDE 0.9 % IV BOLUS (SEPSIS)
1000.0000 mL | Freq: Once | INTRAVENOUS | Status: AC
  Administered 2017-02-12: 1000 mL via INTRAVENOUS

## 2017-02-12 MED ORDER — SODIUM CHLORIDE 0.9 % IV SOLN: INTRAVENOUS | Status: DC

## 2017-02-12 NOTE — ED Notes (Signed)
Bed: GU44 Expected date:  Expected time:  Means of arrival:  Comments: 83 yo gen weak

## 2017-02-12 NOTE — ED Provider Notes (Signed)
[facility] DEPT Provider Note   CSN: [id number] Arrival date & time: 02/12/17  1854     History   Chief Complaint Chief Complaint  Patient presents with  . Weakness    HPI Shelley Cook is a 83 y.o. female.  Pt presents to the ED today with weakness.  Pt said she got up around 0430 on Saturday the 17th to go to the bathroom to urinate.  She said she got up quickly and rushed to the bathroom.  The next thing she knew, she was on the ground.  Her husband was awake, but did not see her fall.  She did not hurt herself and thought she would be ok.  She did see her doctor on Monday the 19th.  Her doctor did labs which pt does not have the results of due to the Thanksgiving holidays.  The pt has been feeling very weak since the syncopal episode.      Past Medical History:  Diagnosis Date  . Arthritis   . High cholesterol   . Hypertension   . Hypothyroidism   . Osteopenia   . URI, acute 08-14-2013  . Wears glasses    read    Patient Active Problem List   Diagnosis Date Noted  . Mitral annular calcification 12/16/2015  . Essential hypertension 12/16/2015  . Bradycardia 12/16/2015  . Post-operative state 08/13/2013    Past Surgical History:  Procedure Laterality Date  . ABDOMINAL HYSTERECTOMY    . BACK SURGERY  2009   lumb lam  . DILATION AND CURETTAGE OF UTERUS    . EYE SURGERY     both cataracts 2013  . fractured femure  1988  . HERNIA REPAIR  2008   left ingunial  . hip replacement left replacement 2009  2009   left total hip  . left heel    . MASS EXCISION N/A 07/18/2013   Procedure: EXCISION SEBACEOUS CYST SCALP;  Surgeon: [name]. Cornett, MD;  Location: [facility];  Service: General;  Laterality: N/A;  . TONSILLECTOMY    . TUBAL LIGATION      OB History    No data available       Home Medications    Prior to Admission medications   Medication Sig Start Date End Date Taking? Authorizing Provider    acetaminophen (TYLENOL) 500 MG tablet Take 500 mg by mouth every 6 (six) hours as needed for mild pain.     [provider]  amLODipine (NORVASC) 2.5 MG tablet Take 2.5 mg by mouth daily.  11/19/15   [provider]  aspirin 81 MG tablet Take 81 mg by mouth daily.    [provider]  calcium-vitamin D (OSCAL WITH D) 500-200 MG-UNIT per tablet Take 1 tablet by mouth daily.    [provider]  carvedilol (COREG) 12.5 MG tablet Take 12.5 mg by mouth 2 (two) times daily with a meal.  10/15/15   [provider]  cloNIDine (CATAPRES) 0.1 MG tablet Take 0.1 mg by mouth 2 (two) times daily.    [provider]  ESTRACE VAGINAL 0.1 MG/GM vaginal cream Place 1 Applicatorful vaginally 2 (two) times a week. Reported on 03/21/2015 06/27/13   [provider]  fluticasone (FLONASE) 50 MCG/ACT nasal spray Place 1 spray into both nostrils as needed for allergies.  09/22/15   [provider]  levothyroxine (SYNTHROID, LEVOTHROID) 75 MCG tablet Take 75 mcg by mouth daily before breakfast.  [provider]  montelukast (SINGULAIR) 10 MG tablet Take 10 mg by mouth at bedtime.  10/26/15   [provider]  Multiple Vitamins-Minerals (MULTIVITAMIN WITH MINERALS) tablet Take 1 tablet by mouth daily.    [provider]  olmesartan (BENICAR) 20 MG tablet Take 20 mg by mouth daily.  10/26/15   [provider]  Probiotic Product (PROBIOTIC & ACIDOPHILUS EX ST PO) Take 1 tablet by mouth daily. Reported on 03/21/2015    [provider]  simvastatin (ZOCOR) 20 MG tablet Take 20 mg by mouth every evening.    [provider]  zoledronic acid (RECLAST) 5 MG/100ML SOLN injection Inject 5 mg into the vein once. Once yearly    [provider]    Family History Family History  Problem Relation Age of Onset  . Heart attack Father     Social History Social History   Tobacco Use  . Smoking status: Never  Smoker  . Smokeless tobacco: Never Used  Substance Use Topics  . Alcohol use: No  . Drug use: No     Allergies   Celebrex [celecoxib]; Doxycycline; Hctz [hydrochlorothiazide]; Levaquin [levofloxacin in d5w]; Sudafed [pseudoephedrine hcl]; Aleve [naproxen sodium]; Asa [aspirin]; Augmentin [amoxicillin-pot clavulanate]; Ceftin [cefuroxime axetil]; Ibuprofen; Keflex [cephalexin]; Nitrofurantoin; Nsaids; Penicillins; Sulfa antibiotics; and Zithromax [azithromycin]   Review of Systems Review of Systems  Constitutional: Positive for fatigue.  All other systems reviewed and are negative.    Physical Exam Updated Vital Signs There were no vitals taken for this visit.  Physical Exam  Constitutional: She is oriented to person, place, and time. She appears well-developed and well-nourished.  HENT:  Head: Normocephalic and atraumatic.  Right Ear: External ear normal.  Left Ear: External ear normal.  Nose: Nose normal.  Mouth/Throat: Oropharynx is clear and moist.  Eyes: Conjunctivae and EOM are normal. Pupils are equal, round, and reactive to light.  Neck: Normal range of motion. Neck supple.  Cardiovascular: Normal rate, regular rhythm, normal heart sounds and intact distal pulses.  Pulmonary/Chest: Effort normal and breath sounds normal.  Abdominal: Soft. Bowel sounds are normal.  Musculoskeletal: Normal range of motion.  Neurological: She is alert and oriented to person, place, and time.  Skin: Skin is warm and dry. Capillary refill takes less than 2 seconds.  Psychiatric: She has a normal mood and affect. Her behavior is normal. Judgment and thought content normal.  Nursing note and vitals reviewed.    ED Treatments / Results  Labs (all labs ordered are listed, but only abnormal results are displayed) Labs Reviewed  BASIC METABOLIC PANEL - Abnormal; Notable for the following components:      Result Value   Sodium 129 (*)    Chloride 93 (*)    Glucose, Bld 114 (*)    All  other components within normal limits  URINALYSIS, ROUTINE W REFLEX MICROSCOPIC - Abnormal; Notable for the following components:   Color, Urine STRAW (*)    All other components within normal limits  CBG MONITORING, ED - Abnormal; Notable for the following components:   Glucose-Capillary 107 (*)    All other components within normal limits  CBC    EKG  EKG Interpretation  Date/Time:  Sunday February 12 2017 19:59:01 EST Ventricular Rate:  61 PR Interval:    QRS Duration: 96 QT Interval:  445 QTC Calculation: 449 R Axis:   7 Text Interpretation:  Sinus rhythm Probable left atrial enlargement Anteroseptal infarct, age indeterminate ST elevation, consider inferior injury No significant  change since last tracing Confirmed by Isla Pence [phone]) on 02/12/2017 8:11:22 PM       Radiology Dg Chest 2 View  Result Date: 02/12/2017 CLINICAL DATA:  Syncope EXAM: CHEST  2 VIEW COMPARISON:  07/10/2008 FINDINGS: Hyperinflation. No focal pulmonary infiltrate or effusion. Normal cardiomediastinal silhouette with atherosclerosis. No pneumothorax. Mild degenerative changes of the spine. IMPRESSION: Hyperinflation without focal infiltrate Electronically Signed   By: Donavan Foil M.D.   On: 02/12/2017 20:27   Ct Head Wo Contrast  Result Date: 02/12/2017 CLINICAL DATA:  Headache fall with increased weakness EXAM: CT HEAD WITHOUT CONTRAST TECHNIQUE: Contiguous axial images were obtained from the base of the skull through the vertex without intravenous contrast. COMPARISON:  None. FINDINGS: Brain: No acute territorial infarction or intracranial mass is visualized. Best seen on the coronal views is possible subacute to chronic sub dural collection along the right cranial convexity measuring 4 mm in thickness. No significant mass effect or midline shift. Mild small vessel ischemic changes of the white matter. Mild to moderate atrophy. Cystic enlargement of the choroid plexus in the lateral ventricles.  Vascular: No hyperdense vessels.  Carotid artery calcifications. Skull: No fracture. Sinuses/Orbits: Opacified maxillary sinuses with mild bony remodeling. No acute orbital abnormality. Other: None IMPRESSION: 1. No definite CT evidence for acute intracranial abnormality. 2. Suggestion of subacute to chronic right convexity subdural collection without significant mass effect. 3. Opacified maxillary sinuses with bony remodeling suggesting chronic sinusitis. Electronically Signed   By: Donavan Foil M.D.   On: 02/12/2017 20:26    Procedures Procedures (including critical care time)  Medications Ordered in ED Medications  sodium chloride 0.9 % bolus 1,000 mL (1,000 mLs Intravenous New Bag/Given 02/12/17 1959)    And  0.9 %  sodium chloride infusion (not administered)     Initial Impression / Assessment and Plan / ED Course  I have reviewed the triage vital signs and the nursing notes.  Pertinent labs & imaging results that were available during my care of the patient were reviewed by me and considered in my medical decision making (see chart for details).    Pt d/w NS about the small SDH.  No intervention required.  The pt has had low sodium for years, so that is not new.  The pt likely had an episode of orthostatic hypotension as the cause of her syncopal episode as syncopal episode occurred after standing up quickly.  Pt encouraged to get up slowly from lying down.  The pt encouraged to wear TED hose.  She knows to return if worse and f/u with pcp.  Final Clinical Impressions(s) / ED Diagnoses   Final diagnoses:  Hyponatremia  Weakness  Subdural hematoma [facility])    ED Discharge Orders    None       Isla Pence, MD 02/12/17 2151

## 2017-02-12 NOTE — Progress Notes (Signed)
Patient ID: [name], female   DOB: 07/10/1929, 83 y.o.   MRN: [id number] Called to review the CT head on this 83 yo female with headache and fall several days ago. CT head shows very tiny isodense area in subdural space on R that has no mass effect, no edema, no shift. I think this is clinically insignificant and unrelated to her clinical presentation. Can f/u with PCP, and I think repeat CT at some point is most likely unwarranted unless there is some clinical change/ suspicion.

## 2017-02-12 NOTE — ED Notes (Signed)
Delay in care due to Pt taken to CT and Xray

## 2017-02-12 NOTE — ED Triage Notes (Signed)
EMS states she fell a week ago and has had increased weakness since, also states she has sinus pressure 140/90-70-98% RA, CBG 181

## 2017-02-20 DIAGNOSIS — H6121 Impacted cerumen, right ear: Secondary | ICD-10-CM | POA: Diagnosis not present

## 2017-02-20 DIAGNOSIS — H6061 Unspecified chronic otitis externa, right ear: Secondary | ICD-10-CM | POA: Diagnosis not present

## 2017-02-20 DIAGNOSIS — I6200 Nontraumatic subdural hemorrhage, unspecified: Secondary | ICD-10-CM | POA: Diagnosis not present

## 2017-02-20 DIAGNOSIS — R42 Dizziness and giddiness: Secondary | ICD-10-CM | POA: Diagnosis not present

## 2017-02-20 DIAGNOSIS — I348 Other nonrheumatic mitral valve disorders: Secondary | ICD-10-CM | POA: Diagnosis not present

## 2017-02-20 DIAGNOSIS — Z681 Body mass index (BMI) 19 or less, adult: Secondary | ICD-10-CM | POA: Diagnosis not present

## 2017-02-20 DIAGNOSIS — M810 Age-related osteoporosis without current pathological fracture: Secondary | ICD-10-CM | POA: Diagnosis not present

## 2017-02-20 DIAGNOSIS — R55 Syncope and collapse: Secondary | ICD-10-CM | POA: Diagnosis not present

## 2017-02-20 DIAGNOSIS — I10 Essential (primary) hypertension: Secondary | ICD-10-CM | POA: Diagnosis not present

## 2017-02-28 ENCOUNTER — Ambulatory Visit: Payer: Medicare Other | Admitting: Interventional Cardiology

## 2017-03-08 ENCOUNTER — Other Ambulatory Visit (HOSPITAL_COMMUNITY): Payer: Self-pay | Admitting: *Deleted

## 2017-03-08 DIAGNOSIS — R350 Frequency of micturition: Secondary | ICD-10-CM | POA: Diagnosis not present

## 2017-03-08 DIAGNOSIS — N898 Other specified noninflammatory disorders of vagina: Secondary | ICD-10-CM | POA: Diagnosis not present

## 2017-03-09 ENCOUNTER — Ambulatory Visit (HOSPITAL_COMMUNITY)
Admission: RE | Admit: 2017-03-09 | Discharge: 2017-03-09 | Disposition: A | Discharge status: Home / Self Care | Payer: Medicare Other | Source: Ambulatory Visit | Attending: Internal Medicine | Admitting: Internal Medicine

## 2017-03-09 DIAGNOSIS — M810 Age-related osteoporosis without current pathological fracture: Secondary | ICD-10-CM | POA: Insufficient documentation

## 2017-03-09 MED ORDER — ZOLEDRONIC ACID 5 MG/100ML IV SOLN
INTRAVENOUS | Status: AC
  Administered 2017-03-09: 11:00:00 5 mg via INTRAVENOUS
  Filled 2017-03-09: qty 100

## 2017-03-09 MED ORDER — ZOLEDRONIC ACID 5 MG/100ML IV SOLN
5.0000 mg | Freq: Once | INTRAVENOUS | Status: AC
  Administered 2017-03-09: 5 mg via INTRAVENOUS

## 2017-03-16 ENCOUNTER — Encounter: Payer: Self-pay | Admitting: *Deleted

## 2017-04-05 NOTE — Progress Notes (Signed)
Cardiology Office Note   Date:  04/06/2017   ID:  Cook RYER, DOB June 09, 1929, MRN [id number]  PCP:  [name], MD    No chief complaint on file. MAC   Wt Readings from Last 3 Encounters:  04/06/17 116 lb (52.6 kg)  03/09/17 115 lb (52.2 kg)  12/16/15 121 lb (54.9 kg)       History of Present Illness: Shelley Cook is a 84 y.o. female  Who had some problems earlier in 2017 with shortness of breath.  This prompted an echocardiogram which showed severe MAC.  She had a syncopal spell in 11/18.  She had to go to the bathroom, with short notice.  She got up from bed and found that her husband was at the sink.  She moved quickly to get around him nad then she fell to the floor.  She did not hurt herself.  BP after she woke up was 117/48.  Episode happened at 4 AM.    Since then, she has not had any further syncope.  SHe is more careful about not getting up too quickly.  She stays hydrated.  She has had some stress due to her husband's health issues.      Past Medical History:  Diagnosis Date  . Arthritis   . High cholesterol   . Hypertension   . Hypothyroidism   . Osteopenia   . Syncope and collapse   . URI, acute 08-14-2013  . Wears glasses    read    Past Surgical History:  Procedure Laterality Date  . ABDOMINAL HYSTERECTOMY    . BACK SURGERY  2009   lumb lam  . DILATION AND CURETTAGE OF UTERUS    . EYE SURGERY     both cataracts 2013  . fractured femure  1988  . HERNIA REPAIR  2008   left ingunial  . hip replacement left replacement 2009  2009   left total hip  . left heel    . MASS EXCISION N/A 07/18/2013   Procedure: EXCISION SEBACEOUS CYST SCALP;  Surgeon: [name]. Cornett, MD;  Location: [facility];  Service: General;  Laterality: N/A;  . TONSILLECTOMY    . TUBAL LIGATION       Current Outpatient Medications  Medication Sig Dispense Refill  . acetaminophen (TYLENOL) 500 MG tablet Take 500 mg by mouth every 6 (six)  hours as needed for mild pain.     [name] amLODipine (NORVASC) 2.5 MG tablet Take 2.5 mg by mouth daily.     [name] aspirin 81 MG tablet Take 81 mg by mouth daily.    . calcium-vitamin D (OSCAL WITH D) 500-200 MG-UNIT per tablet Take 1 tablet by mouth daily.    . carvedilol (COREG) 12.5 MG tablet Take 12.5 mg by mouth 2 (two) times daily with a meal.     . cloNIDine (CATAPRES) 0.1 MG tablet Take 0.1 mg by mouth 2 (two) times daily.    [name] ESTRACE VAGINAL 0.1 MG/GM vaginal cream Place 1 Applicatorful vaginally 2 (two) times a week. Reported on 03/21/2015    . fluticasone (FLONASE) 50 MCG/ACT nasal spray Place 1 spray into both nostrils as needed for allergies.     [name] levothyroxine (SYNTHROID, LEVOTHROID) 75 MCG tablet Take 75 mcg by mouth daily before breakfast.    . montelukast (SINGULAIR) 10 MG tablet Take 10 mg by mouth at bedtime.     . Multiple Vitamins-Minerals (MULTIVITAMIN WITH MINERALS) tablet Take 1 tablet by  mouth daily.    [name] olmesartan (BENICAR) 20 MG tablet Take 20 mg by mouth daily.     . Probiotic Product (PROBIOTIC & ACIDOPHILUS EX ST PO) Take 1 tablet by mouth daily. Reported on 03/21/2015    . simvastatin (ZOCOR) 20 MG tablet Take 20 mg by mouth every evening.    . zoledronic acid (RECLAST) 5 MG/100ML SOLN injection Inject 5 mg into the vein once. Once yearly     No current facility-administered medications for this visit.     Allergies:   Celebrex [celecoxib]; Doxycycline; Hctz [hydrochlorothiazide]; Levaquin [levofloxacin in d5w]; Sudafed [pseudoephedrine hcl]; Aleve [naproxen sodium]; Asa [aspirin]; Augmentin [amoxicillin-pot clavulanate]; Ceftin [cefuroxime axetil]; Ibuprofen; Keflex [cephalexin]; Nitrofurantoin; Nsaids; Penicillins; Sulfa antibiotics; and Zithromax [azithromycin]    Social History:  The patient  reports that  has never smoked. she has never used smokeless tobacco. She reports that she does not drink alcohol or use drugs.   Family History:  The patient's family  history includes Arthritis in her mother; Diabetes Mellitus II in her brother; Heart attack in her father; Hyperlipidemia in her brother; Hypertension in her brother, brother, father, and mother; Lung cancer (age of onset: 56) in her brother; Osteopenia in her mother; Ovarian cancer (age of onset: 10) in her mother.    ROS:  Please see the history of present illness.   Otherwise, review of systems are positive for syncope a few months ago.   All other systems are reviewed and negative.    PHYSICAL EXAM: VS:  BP 138/76   Pulse (!) 51   Ht _0  (1.651 m)   Wt 116 lb (52.6 kg)   SpO2 97%   BMI 19.30 kg/m  , BMI Body mass index is 19.3 kg/m. GEN: Well nourished, well developed, in no acute distress  HEENT: normal  Neck: no JVD, carotid bruits, or masses Cardiac: bradycardic; 1/6 systolic murmurs, rubs, or gallops,no edema  Respiratory:  clear to auscultation bilaterally, normal work of breathing GI: soft, nontender, nondistended, + BS MS: no deformity or atrophy  Skin: warm and dry, no rash Neuro:  Strength and sensation are intact Psych: euthymic mood, full affect   EKG:   The ekg ordered today demonstrates sinus bradycardia, Septal Q waves   Recent Labs: 02/12/2017: BUN 16; Creatinine, Ser 0.77; Hemoglobin 14.3; Platelets 235; Potassium 4.1; Sodium 129   Lipid Panel No results found for: CHOL, TRIG, HDL, CHOLHDL, VLDL, LDLCALC, LDLDIRECT   Other studies Reviewed: Additional studies/ records that were reviewed today with results demonstrating: 2017 echo reviewed.  Head CT in 11/18 showed possible subdural hematoma.     ASSESSMENT AND PLAN:   1. Syncope: No further episodes since November.  Stay well hydrated.  I think her syncope was most likely related orthostasis in the middle of the night.  Bradycardia would give her less reserve in a situation like this. 2. Hypertension: Would like to change BP meds, but she wants to discuss with Dr. Dagmar Hait.  She has been very sensitive  to blood pressure medicines in the past and is hesitant to make a change.  I would recommend to : Increase amlodipine to 5 mg daily.  Decrease carvedilol as noted below.  If BP high, would continue to increase amlodipine and avoid AV nodal blockers. 3. Bradycardia: Decrease carvedilol to 3.125 mg BID.  Resting heart rate 51 bpm today.  She will check with Dr. Dagmar Hait before making a change. 4.   Mitral annular calcification: Echo from 2017 showed mild mitral  regurgitation and mild mitral stenosis.  Severe mitral annular calcification was present but this was not significantly affecting the function of the valve.   Current medicines are reviewed at length with the patient today.  The patient concerns regarding her medicines were addressed.  The following changes have been made:  No change  Labs/ tests ordered today include:  No orders of the defined types were placed in this encounter.   Recommend 150 minutes/week of aerobic exercise Low fat, low carb, high fiber diet recommended  Disposition:   FU as needed   Signed, [name], MD  04/06/2017 10:02 AM    [facility], [place], [place]  [address] Phone: [phone]; Fax: [phone]

## 2017-04-06 ENCOUNTER — Encounter: Payer: Self-pay | Admitting: Interventional Cardiology

## 2017-04-06 ENCOUNTER — Ambulatory Visit (INDEPENDENT_AMBULATORY_CARE_PROVIDER_SITE_OTHER): Payer: Medicare Other | Admitting: Interventional Cardiology

## 2017-04-06 VITALS — BP 138/76 | HR 51 | Ht 65.0 in | Wt 116.0 lb

## 2017-04-06 DIAGNOSIS — I059 Rheumatic mitral valve disease, unspecified: Secondary | ICD-10-CM

## 2017-04-06 DIAGNOSIS — R001 Bradycardia, unspecified: Secondary | ICD-10-CM

## 2017-04-06 DIAGNOSIS — I3481 Nonrheumatic mitral (valve) annulus calcification: Secondary | ICD-10-CM

## 2017-04-06 DIAGNOSIS — I10 Essential (primary) hypertension: Secondary | ICD-10-CM

## 2017-04-06 DIAGNOSIS — R55 Syncope and collapse: Secondary | ICD-10-CM

## 2017-04-06 NOTE — Patient Instructions (Signed)
Medication Instructions:  Your physician recommends that you continue on your current medications as directed. Please refer to the Current Medication list given to you today.   Labwork: None ordered  Testing/Procedures: None ordered  Follow-Up: Your physician recommends that you schedule a follow-up appointment with your Primary Care Doctor.  Your physician wants you to follow-up with Dr. [name] AS NEEDED.  Any Other Special Instructions Will Be Listed Below (If Applicable).     If you need a refill on your cardiac medications before your next appointment, please call your pharmacy.

## 2017-04-25 DIAGNOSIS — H6121 Impacted cerumen, right ear: Secondary | ICD-10-CM | POA: Diagnosis not present

## 2017-04-25 DIAGNOSIS — H6061 Unspecified chronic otitis externa, right ear: Secondary | ICD-10-CM | POA: Diagnosis not present

## 2017-05-02 DIAGNOSIS — H26492 Other secondary cataract, left eye: Secondary | ICD-10-CM | POA: Diagnosis not present

## 2017-05-24 DIAGNOSIS — H26492 Other secondary cataract, left eye: Secondary | ICD-10-CM | POA: Diagnosis not present

## 2017-06-05 DIAGNOSIS — H6121 Impacted cerumen, right ear: Secondary | ICD-10-CM | POA: Diagnosis not present

## 2017-06-05 DIAGNOSIS — H6061 Unspecified chronic otitis externa, right ear: Secondary | ICD-10-CM | POA: Diagnosis not present

## 2017-06-09 DIAGNOSIS — R109 Unspecified abdominal pain: Secondary | ICD-10-CM | POA: Diagnosis not present

## 2017-06-09 DIAGNOSIS — Z681 Body mass index (BMI) 19 or less, adult: Secondary | ICD-10-CM | POA: Diagnosis not present

## 2017-06-12 ENCOUNTER — Other Ambulatory Visit: Payer: Self-pay | Admitting: Registered Nurse

## 2017-06-12 DIAGNOSIS — R109 Unspecified abdominal pain: Secondary | ICD-10-CM

## 2017-06-20 ENCOUNTER — Ambulatory Visit
Admission: RE | Admit: 2017-06-20 | Discharge: 2017-06-20 | Disposition: A | Discharge status: Home / Self Care | Payer: Medicare Other | Source: Ambulatory Visit | Attending: Registered Nurse | Admitting: Registered Nurse

## 2017-06-20 DIAGNOSIS — R109 Unspecified abdominal pain: Secondary | ICD-10-CM

## 2017-06-20 DIAGNOSIS — K449 Diaphragmatic hernia without obstruction or gangrene: Secondary | ICD-10-CM | POA: Diagnosis not present

## 2017-06-20 MED ORDER — IOPAMIDOL (ISOVUE-300) INJECTION 61%
100.0000 mL | Freq: Once | INTRAVENOUS | Status: AC | PRN
  Administered 2017-06-20: 100 mL via INTRAVENOUS

## 2017-06-29 DIAGNOSIS — Z124 Encounter for screening for malignant neoplasm of cervix: Secondary | ICD-10-CM | POA: Diagnosis not present

## 2017-06-29 DIAGNOSIS — N952 Postmenopausal atrophic vaginitis: Secondary | ICD-10-CM | POA: Diagnosis not present

## 2017-07-14 ENCOUNTER — Other Ambulatory Visit: Payer: Self-pay | Admitting: Internal Medicine

## 2017-07-14 DIAGNOSIS — Z1231 Encounter for screening mammogram for malignant neoplasm of breast: Secondary | ICD-10-CM

## 2017-08-01 DIAGNOSIS — H6122 Impacted cerumen, left ear: Secondary | ICD-10-CM | POA: Diagnosis not present

## 2017-08-08 ENCOUNTER — Ambulatory Visit
Admission: RE | Admit: 2017-08-08 | Discharge: 2017-08-08 | Disposition: A | Discharge status: Home / Self Care | Payer: Medicare Other | Source: Ambulatory Visit | Attending: Internal Medicine | Admitting: Internal Medicine

## 2017-08-08 DIAGNOSIS — Z1231 Encounter for screening mammogram for malignant neoplasm of breast: Secondary | ICD-10-CM | POA: Diagnosis not present

## 2017-08-22 DIAGNOSIS — D692 Other nonthrombocytopenic purpura: Secondary | ICD-10-CM | POA: Diagnosis not present

## 2017-08-22 DIAGNOSIS — L814 Other melanin hyperpigmentation: Secondary | ICD-10-CM | POA: Diagnosis not present

## 2017-08-22 DIAGNOSIS — L738 Other specified follicular disorders: Secondary | ICD-10-CM | POA: Diagnosis not present

## 2017-08-22 DIAGNOSIS — L821 Other seborrheic keratosis: Secondary | ICD-10-CM | POA: Diagnosis not present

## 2017-08-22 DIAGNOSIS — D1801 Hemangioma of skin and subcutaneous tissue: Secondary | ICD-10-CM | POA: Diagnosis not present

## 2017-09-12 DIAGNOSIS — E038 Other specified hypothyroidism: Secondary | ICD-10-CM | POA: Diagnosis not present

## 2017-09-12 DIAGNOSIS — I10 Essential (primary) hypertension: Secondary | ICD-10-CM | POA: Diagnosis not present

## 2017-09-12 DIAGNOSIS — E7849 Other hyperlipidemia: Secondary | ICD-10-CM | POA: Diagnosis not present

## 2017-09-12 DIAGNOSIS — R82998 Other abnormal findings in urine: Secondary | ICD-10-CM | POA: Diagnosis not present

## 2017-09-12 DIAGNOSIS — M810 Age-related osteoporosis without current pathological fracture: Secondary | ICD-10-CM | POA: Diagnosis not present

## 2017-09-19 DIAGNOSIS — I10 Essential (primary) hypertension: Secondary | ICD-10-CM | POA: Diagnosis not present

## 2017-09-19 DIAGNOSIS — M810 Age-related osteoporosis without current pathological fracture: Secondary | ICD-10-CM | POA: Diagnosis not present

## 2017-09-19 DIAGNOSIS — I872 Venous insufficiency (chronic) (peripheral): Secondary | ICD-10-CM | POA: Diagnosis not present

## 2017-09-19 DIAGNOSIS — M5380 Other specified dorsopathies, site unspecified: Secondary | ICD-10-CM | POA: Diagnosis not present

## 2017-09-19 DIAGNOSIS — R209 Unspecified disturbances of skin sensation: Secondary | ICD-10-CM | POA: Diagnosis not present

## 2017-09-19 DIAGNOSIS — G4709 Other insomnia: Secondary | ICD-10-CM | POA: Diagnosis not present

## 2017-09-19 DIAGNOSIS — Z0000 Encounter for general adult medical examination without abnormal findings: Secondary | ICD-10-CM | POA: Diagnosis not present

## 2017-09-19 DIAGNOSIS — Z1389 Encounter for screening for other disorder: Secondary | ICD-10-CM | POA: Diagnosis not present

## 2017-09-19 DIAGNOSIS — J302 Other seasonal allergic rhinitis: Secondary | ICD-10-CM | POA: Diagnosis not present

## 2017-09-19 DIAGNOSIS — E038 Other specified hypothyroidism: Secondary | ICD-10-CM | POA: Diagnosis not present

## 2017-09-19 DIAGNOSIS — Z681 Body mass index (BMI) 19 or less, adult: Secondary | ICD-10-CM | POA: Diagnosis not present

## 2017-09-19 DIAGNOSIS — E7849 Other hyperlipidemia: Secondary | ICD-10-CM | POA: Diagnosis not present

## 2017-09-22 DIAGNOSIS — M25561 Pain in right knee: Secondary | ICD-10-CM | POA: Diagnosis not present

## 2017-09-22 DIAGNOSIS — M545 Low back pain: Secondary | ICD-10-CM | POA: Diagnosis not present

## 2017-09-23 ENCOUNTER — Emergency Department (HOSPITAL_COMMUNITY): Payer: Medicare Other

## 2017-09-23 ENCOUNTER — Emergency Department (HOSPITAL_COMMUNITY)
Admission: EM | Admit: 2017-09-23 | Discharge: 2017-09-23 | Disposition: A | Discharge status: Home / Self Care | Payer: Medicare Other | Attending: Emergency Medicine | Admitting: Emergency Medicine

## 2017-09-23 ENCOUNTER — Other Ambulatory Visit: Payer: Self-pay

## 2017-09-23 DIAGNOSIS — E039 Hypothyroidism, unspecified: Secondary | ICD-10-CM | POA: Diagnosis not present

## 2017-09-23 DIAGNOSIS — R001 Bradycardia, unspecified: Secondary | ICD-10-CM | POA: Diagnosis not present

## 2017-09-23 DIAGNOSIS — R0789 Other chest pain: Secondary | ICD-10-CM | POA: Diagnosis not present

## 2017-09-23 DIAGNOSIS — I493 Ventricular premature depolarization: Secondary | ICD-10-CM | POA: Insufficient documentation

## 2017-09-23 DIAGNOSIS — Z7982 Long term (current) use of aspirin: Secondary | ICD-10-CM | POA: Insufficient documentation

## 2017-09-23 DIAGNOSIS — Z96642 Presence of left artificial hip joint: Secondary | ICD-10-CM | POA: Diagnosis not present

## 2017-09-23 DIAGNOSIS — I10 Essential (primary) hypertension: Secondary | ICD-10-CM | POA: Diagnosis not present

## 2017-09-23 DIAGNOSIS — R55 Syncope and collapse: Secondary | ICD-10-CM | POA: Diagnosis not present

## 2017-09-23 DIAGNOSIS — R079 Chest pain, unspecified: Secondary | ICD-10-CM | POA: Diagnosis not present

## 2017-09-23 DIAGNOSIS — R0602 Shortness of breath: Secondary | ICD-10-CM | POA: Diagnosis not present

## 2017-09-23 DIAGNOSIS — Z79899 Other long term (current) drug therapy: Secondary | ICD-10-CM | POA: Insufficient documentation

## 2017-09-23 DIAGNOSIS — R531 Weakness: Secondary | ICD-10-CM | POA: Diagnosis not present

## 2017-09-23 LAB — BASIC METABOLIC PANEL
ANION GAP: 8 (ref 5–15)
BUN: 15 mg/dL (ref 8–23)
CHLORIDE: 95 mmol/L — AB (ref 98–111)
CO2: 26 mmol/L (ref 22–32)
Calcium: 9.1 mg/dL (ref 8.9–10.3)
Creatinine, Ser: 0.82 mg/dL (ref 0.44–1.00)
GFR calc Af Amer: >60 mL/min (ref >60)
GFR calc non Af Amer: >60 mL/min (ref >60)
GLUCOSE: 123 mg/dL — AB (ref 70–99)
POTASSIUM: 4.6 mmol/L (ref 3.5–5.1)
Sodium: 129 mmol/L — ABNORMAL LOW (ref 135–145)

## 2017-09-23 LAB — CBC WITH DIFFERENTIAL/PLATELET
ABS IMMATURE GRANULOCYTES: 0.1 10*3/uL (ref 0.0–0.1)
Basophils Absolute: 0 10*3/uL (ref 0.0–0.1)
Basophils Relative: 0 %
EOS PCT: 0 %
Eosinophils Absolute: 0 10*3/uL (ref 0.0–0.7)
HEMATOCRIT: 39.9 % (ref 36.0–46.0)
HEMOGLOBIN: 13.5 g/dL (ref 12.0–15.0)
Immature Granulocytes: 0 %
LYMPHS ABS: 0.5 10*3/uL — AB (ref 0.7–4.0)
LYMPHS PCT: 4 %
MCH: 32 pg (ref 26.0–34.0)
MCHC: 33.8 g/dL (ref 30.0–36.0)
MCV: 94.5 fL (ref 78.0–100.0)
MONO ABS: 0.6 10*3/uL (ref 0.1–1.0)
Monocytes Relative: 5 %
NEUTROS ABS: 11.1 10*3/uL — AB (ref 1.7–7.7)
Neutrophils Relative %: 91 %
Platelets: 193 10*3/uL (ref 150–400)
RBC: 4.22 MIL/uL (ref 3.87–5.11)
RDW: 12.5 % (ref 11.5–15.5)
WBC: 12.3 10*3/uL — ABNORMAL HIGH (ref 4.0–10.5)

## 2017-09-23 LAB — MAGNESIUM: Magnesium: 2.2 mg/dL (ref 1.7–2.4)

## 2017-09-23 NOTE — ED Notes (Signed)
Placed pt on pw

## 2017-09-23 NOTE — ED Triage Notes (Signed)
PT  Checking her BP 2 times a day  Because PCP had requested to follow BP's . Pt reported HR was in the 30's. Pt instructed to come by ED by PCP.

## 2017-09-23 NOTE — ED Provider Notes (Signed)
[facility] EMERGENCY DEPARTMENT Provider Note   CSN: [id number] Arrival date & time: 09/23/17  1158     History   Chief Complaint Chief Complaint  Patient presents with  . Bradycardia    HPI [facility] is a 84 y.o. female.  84 year old female with prior history of hypertension, hypothyroidism, bradycardia (HR in 50's) presents for concern regarding her bradycardia.  Patient reports that she was checking her blood pressure at home.  She reports that her home blood pressure cuff gave her a heart rate of 35.  She denies any associated symptoms with same.  She called her regular doctor who instructed her to come to the ED for evaluation.  She reports that her normal heart rate is in the 50s to 60s.  She currently denies any associated lightheadedness, dizziness, chest pain, shortness of breath, or other complaint.    She appears anxious  The history is provided by the patient and medical records.  Illness  This is a new problem. The current episode started 1 to 2 hours ago. The problem occurs rarely. The problem has been resolved. Pertinent negatives include no chest pain, no abdominal pain, no headaches and no shortness of breath. Nothing aggravates the symptoms. Nothing relieves the symptoms. She has tried nothing for the symptoms.    Past Medical History:  Diagnosis Date  . Arthritis   . High cholesterol   . Hypertension   . Hypothyroidism   . Osteopenia   . Syncope and collapse   . URI, acute 08-14-2013  . Wears glasses    read    Patient Active Problem List   Diagnosis Date Noted  . Mitral annular calcification 12/16/2015  . Essential hypertension 12/16/2015  . Bradycardia 12/16/2015  . Post-operative state 08/13/2013    Past Surgical History:  Procedure Laterality Date  . ABDOMINAL HYSTERECTOMY    . BACK SURGERY  2009   lumb lam  . DILATION AND CURETTAGE OF UTERUS    . EYE SURGERY     both cataracts 2013  . fractured femure  1988  .  HERNIA REPAIR  2008   left ingunial  . hip replacement left replacement 2009  2009   left total hip  . left heel    . MASS EXCISION N/A 07/18/2013   Procedure: EXCISION SEBACEOUS CYST SCALP;  Surgeon: [name]. Cornett, MD;  Location: [facility];  Service: General;  Laterality: N/A;  . TONSILLECTOMY    . TUBAL LIGATION       OB History   None      Home Medications    Prior to Admission medications   Medication Sig Start Date End Date Taking? Authorizing Provider  acetaminophen (TYLENOL) 500 MG tablet Take 500 mg by mouth every 6 (six) hours as needed for mild pain.    Yes [provider]  amLODipine (NORVASC) 2.5 MG tablet Take 2.5 mg by mouth every evening.  11/19/15  Yes [provider]  aspirin 81 MG tablet Take 81 mg by mouth every evening.    Yes [provider]  calcium-vitamin D (OSCAL WITH D) 500-200 MG-UNIT per tablet Take 1 tablet by mouth every evening. chewable   Yes [provider]  carvedilol (COREG) 12.5 MG tablet Take 12.5 mg by mouth 2 (two) times daily with a meal.  10/15/15  Yes [provider]  cloNIDine (CATAPRES) 0.1 MG tablet Take 0.1 mg by mouth 2 (two) times daily.   Yes [provider]  ESTRACE VAGINAL 0.1 MG/GM vaginal cream Place 1 Applicatorful vaginally 2 (two) times a week. Reported on 03/21/2015 06/27/13  Yes [provider]  fluticasone (FLONASE) 50 MCG/ACT nasal spray Place 1 spray into both nostrils as needed for allergies.  09/22/15  Yes [provider]  levothyroxine (SYNTHROID, LEVOTHROID) 75 MCG tablet Take 75 mcg by mouth daily before breakfast.   Yes [provider]  montelukast (SINGULAIR) 10 MG tablet Take 10 mg by mouth every evening.  10/26/15  Yes [provider]  Multiple Vitamins-Minerals (MULTIVITAMIN WITH MINERALS) tablet Take 1 tablet by mouth every evening.    Yes [provider]  olmesartan (BENICAR) 20 MG tablet Take 20 mg by  mouth every evening.  10/26/15  Yes [provider]  Probiotic Product (PROBIOTIC & ACIDOPHILUS EX ST PO) Take 1 tablet by mouth every evening. Reported on 03/21/2015   Yes [provider]  simvastatin (ZOCOR) 20 MG tablet Take 20 mg by mouth every evening.   Yes [provider]  vitamin C (ASCORBIC ACID) 500 MG tablet Take 500 mg by mouth every evening.   Yes [provider]  zoledronic acid (RECLAST) 5 MG/100ML SOLN injection Inject 5 mg into the vein once. Once yearly   Yes [provider]  traZODone (DESYREL) 50 MG tablet Take 25 mg by mouth at bedtime. 09/19/17   [provider]    Family History Family History  Problem Relation Age of Onset  . Ovarian cancer Mother 82  . Arthritis Mother        DJD  . Hypertension Mother   . Osteopenia Mother   . Heart attack Father   . Hypertension Father   . Diabetes Mellitus II Brother   . Hypertension Brother   . Hyperlipidemia Brother   . Lung cancer Brother 41  . Hypertension Brother     Social History Social History   Tobacco Use  . Smoking status: Never Smoker  . Smokeless tobacco: Never Used  Substance Use Topics  . Alcohol use: No  . Drug use: No     Allergies   Celebrex [celecoxib]; Doxycycline; Hctz [hydrochlorothiazide]; Levaquin [levofloxacin in d5w]; Sudafed [pseudoephedrine hcl]; Aleve [naproxen sodium]; Asa [aspirin]; Augmentin [amoxicillin-pot clavulanate]; Ceftin [cefuroxime axetil]; Ibuprofen; Keflex [cephalexin]; Nitrofurantoin; Nsaids; Penicillins; Sulfa antibiotics; and Zithromax [azithromycin]   Review of Systems Review of Systems  Respiratory: Negative for shortness of breath.   Cardiovascular: Negative for chest pain.  Gastrointestinal: Negative for abdominal pain.  Neurological: Negative for headaches.  All other systems reviewed and are negative.    Physical Exam Updated Vital Signs BP (!) 162/70   Pulse (!) 33   Temp 98 F (36.7 C) (Oral)    Resp 16   Ht 5\' 5"  (1.651 m)   Wt 51.7 kg (114 lb)   SpO2 98%   BMI 18.97 kg/m   Physical Exam  Constitutional: She is oriented to person, place, and time. She appears well-developed and well-nourished. No distress.  HENT:  Head: Normocephalic and atraumatic.  Mouth/Throat: Oropharynx is clear and moist.  Eyes: Pupils are equal, round, and reactive to light. Conjunctivae and EOM are normal.  Neck: Normal range of motion. Neck supple.  Cardiovascular: Normal rate, regular rhythm and normal heart sounds.  Pulmonary/Chest: Effort normal and breath sounds normal. No respiratory distress.  Abdominal: Soft. She exhibits no distension. There is no tenderness.  Musculoskeletal: Normal range of motion. She exhibits no edema or deformity.  Neurological: She is alert and oriented to person,  place, and time.  Skin: Skin is warm and dry.  Psychiatric: She has a normal mood and affect.  Nursing note and vitals reviewed.    ED Treatments / Results  Labs (all labs ordered are listed, but only abnormal results are displayed) Labs Reviewed  BASIC METABOLIC PANEL - Abnormal; Notable for the following components:      Result Value   Sodium 129 (*)    Chloride 95 (*)    Glucose, Bld 123 (*)    All other components within normal limits  CBC WITH DIFFERENTIAL/PLATELET - Abnormal; Notable for the following components:   WBC 12.3 (*)    Neutro Abs 11.1 (*)    Lymphs Abs 0.5 (*)    All other components within normal limits  MAGNESIUM    EKG EKG Interpretation  Date/Time:  Saturday September 23 2017 12:09:40 EDT Ventricular Rate:  87 PR Interval:    QRS Duration: 95 QT Interval:  413 QTC Calculation: 377 R Axis:   -10 Text Interpretation:  Sinus rhythm Ventricular bigeminy Biatrial enlargement Low voltage, precordial leads Probable anteroseptal infarct, old Confirmed by Dene Gentry [phone]) on 09/23/2017 12:16:29 PM   Radiology Dg Chest 1 View  Result Date: 09/23/2017 CLINICAL DATA:   Weakness EXAM: CHEST  1 VIEW COMPARISON:  02/12/2017 chest radiograph. FINDINGS: Pacer pad overlies the left chest. Stable cardiomediastinal silhouette with normal heart size. No pneumothorax. No pleural effusion. Lungs appear clear, with no acute consolidative airspace disease and no pulmonary edema. IMPRESSION: No active disease. Electronically Signed   By: Ilona Sorrel M.D.   On: 09/23/2017 12:33    Procedures Procedures (including critical care time)  Medications Ordered in ED Medications - No data to display   Initial Impression / Assessment and Plan / ED Course  I have reviewed the triage vital signs and the nursing notes.  Pertinent labs & imaging results that were available during my care of the patient were reviewed by me and considered in my medical decision making (see chart for details).     MDM  Screen complete  Patient is presenting for evaluation of suspected bradycardia.  Monitoring in the ED does not reveal significant bradycardia.  Patient remained asymptomatic during her observation period in the ED.  Screening labs did not reveal any other acute abnormality.  After extensive discussion with the patient, she desires discharge home.  She declines further observation and/or monitoring.  She is aware of the need to return if symptoms return.  Final Clinical Impressions(s) / ED Diagnoses   Final diagnoses:  PVC (premature ventricular contraction)  Bradycardia    ED Discharge Orders    None       Valarie Merino, MD 09/23/17 [phone]

## 2017-09-23 NOTE — Discharge Instructions (Signed)
Please return for any problem. Follow up with your regular physician on Monday as instructed.  °

## 2017-09-25 DIAGNOSIS — Z1212 Encounter for screening for malignant neoplasm of rectum: Secondary | ICD-10-CM | POA: Diagnosis not present

## 2017-10-16 ENCOUNTER — Encounter: Payer: Self-pay | Admitting: Gastroenterology

## 2017-10-16 DIAGNOSIS — J301 Allergic rhinitis due to pollen: Secondary | ICD-10-CM | POA: Diagnosis not present

## 2017-10-16 DIAGNOSIS — H6122 Impacted cerumen, left ear: Secondary | ICD-10-CM | POA: Diagnosis not present

## 2017-10-16 DIAGNOSIS — H6062 Unspecified chronic otitis externa, left ear: Secondary | ICD-10-CM | POA: Diagnosis not present

## 2017-10-19 DIAGNOSIS — N390 Urinary tract infection, site not specified: Secondary | ICD-10-CM | POA: Diagnosis not present

## 2017-10-19 DIAGNOSIS — R001 Bradycardia, unspecified: Secondary | ICD-10-CM | POA: Diagnosis not present

## 2017-10-19 DIAGNOSIS — I872 Venous insufficiency (chronic) (peripheral): Secondary | ICD-10-CM | POA: Diagnosis not present

## 2017-10-19 DIAGNOSIS — E038 Other specified hypothyroidism: Secondary | ICD-10-CM | POA: Diagnosis not present

## 2017-10-19 DIAGNOSIS — K5909 Other constipation: Secondary | ICD-10-CM | POA: Diagnosis not present

## 2017-10-19 DIAGNOSIS — E871 Hypo-osmolality and hyponatremia: Secondary | ICD-10-CM | POA: Diagnosis not present

## 2017-10-19 DIAGNOSIS — Z681 Body mass index (BMI) 19 or less, adult: Secondary | ICD-10-CM | POA: Diagnosis not present

## 2017-10-19 DIAGNOSIS — I10 Essential (primary) hypertension: Secondary | ICD-10-CM | POA: Diagnosis not present

## 2017-10-24 DIAGNOSIS — R001 Bradycardia, unspecified: Secondary | ICD-10-CM | POA: Diagnosis not present

## 2017-10-24 DIAGNOSIS — K5900 Constipation, unspecified: Secondary | ICD-10-CM | POA: Diagnosis not present

## 2017-10-24 DIAGNOSIS — E038 Other specified hypothyroidism: Secondary | ICD-10-CM | POA: Diagnosis not present

## 2017-10-24 DIAGNOSIS — E871 Hypo-osmolality and hyponatremia: Secondary | ICD-10-CM | POA: Diagnosis not present

## 2017-10-24 DIAGNOSIS — Z681 Body mass index (BMI) 19 or less, adult: Secondary | ICD-10-CM | POA: Diagnosis not present

## 2017-10-24 DIAGNOSIS — I10 Essential (primary) hypertension: Secondary | ICD-10-CM | POA: Diagnosis not present

## 2017-10-24 DIAGNOSIS — N390 Urinary tract infection, site not specified: Secondary | ICD-10-CM | POA: Diagnosis not present

## 2017-10-25 DIAGNOSIS — E871 Hypo-osmolality and hyponatremia: Secondary | ICD-10-CM | POA: Diagnosis not present

## 2017-10-25 DIAGNOSIS — R001 Bradycardia, unspecified: Secondary | ICD-10-CM | POA: Diagnosis not present

## 2017-10-25 DIAGNOSIS — K5900 Constipation, unspecified: Secondary | ICD-10-CM | POA: Diagnosis not present

## 2017-10-25 DIAGNOSIS — Z681 Body mass index (BMI) 19 or less, adult: Secondary | ICD-10-CM | POA: Diagnosis not present

## 2017-10-25 DIAGNOSIS — I10 Essential (primary) hypertension: Secondary | ICD-10-CM | POA: Diagnosis not present

## 2017-10-26 ENCOUNTER — Emergency Department (HOSPITAL_COMMUNITY): Payer: Medicare Other

## 2017-10-26 ENCOUNTER — Encounter (HOSPITAL_COMMUNITY): Payer: Self-pay | Admitting: *Deleted

## 2017-10-26 ENCOUNTER — Telehealth: Payer: Self-pay | Admitting: Interventional Cardiology

## 2017-10-26 ENCOUNTER — Emergency Department (HOSPITAL_COMMUNITY)
Admission: EM | Admit: 2017-10-26 | Discharge: 2017-10-26 | Disposition: A | Discharge status: Home / Self Care | Payer: Medicare Other | Attending: Emergency Medicine | Admitting: Emergency Medicine

## 2017-10-26 DIAGNOSIS — E871 Hypo-osmolality and hyponatremia: Secondary | ICD-10-CM | POA: Diagnosis not present

## 2017-10-26 DIAGNOSIS — R5383 Other fatigue: Secondary | ICD-10-CM

## 2017-10-26 DIAGNOSIS — Z79899 Other long term (current) drug therapy: Secondary | ICD-10-CM | POA: Diagnosis not present

## 2017-10-26 DIAGNOSIS — E039 Hypothyroidism, unspecified: Secondary | ICD-10-CM | POA: Diagnosis not present

## 2017-10-26 DIAGNOSIS — I491 Atrial premature depolarization: Secondary | ICD-10-CM | POA: Diagnosis not present

## 2017-10-26 DIAGNOSIS — R61 Generalized hyperhidrosis: Secondary | ICD-10-CM | POA: Diagnosis not present

## 2017-10-26 DIAGNOSIS — I10 Essential (primary) hypertension: Secondary | ICD-10-CM | POA: Insufficient documentation

## 2017-10-26 DIAGNOSIS — E860 Dehydration: Secondary | ICD-10-CM | POA: Diagnosis not present

## 2017-10-26 DIAGNOSIS — R42 Dizziness and giddiness: Secondary | ICD-10-CM | POA: Diagnosis not present

## 2017-10-26 DIAGNOSIS — R001 Bradycardia, unspecified: Secondary | ICD-10-CM

## 2017-10-26 DIAGNOSIS — R531 Weakness: Secondary | ICD-10-CM | POA: Diagnosis not present

## 2017-10-26 LAB — CBC
HCT: 43.5 % (ref 36.0–46.0)
Hemoglobin: 15.3 g/dL — ABNORMAL HIGH (ref 12.0–15.0)
MCH: 33.4 pg (ref 26.0–34.0)
MCHC: 35.2 g/dL (ref 30.0–36.0)
MCV: 95 fL (ref 78.0–100.0)
PLATELETS: 226 10*3/uL (ref 150–400)
RBC: 4.58 MIL/uL (ref 3.87–5.11)
RDW: 13.2 % (ref 11.5–15.5)
WBC: 6.8 10*3/uL (ref 4.0–10.5)

## 2017-10-26 LAB — URINALYSIS, ROUTINE W REFLEX MICROSCOPIC
Bilirubin Urine: NEGATIVE
Glucose, UA: NEGATIVE mg/dL
Ketones, ur: 5 mg/dL — AB
LEUKOCYTES UA: NEGATIVE
Nitrite: NEGATIVE
Protein, ur: NEGATIVE mg/dL
SPECIFIC GRAVITY, URINE: 1.002 — AB (ref 1.005–1.030)
pH: 8 (ref 5.0–8.0)

## 2017-10-26 LAB — BASIC METABOLIC PANEL
Anion gap: 9 (ref 5–15)
BUN: 9 mg/dL (ref 8–23)
CALCIUM: 8.9 mg/dL (ref 8.9–10.3)
CO2: 24 mmol/L (ref 22–32)
CREATININE: 0.73 mg/dL (ref 0.44–1.00)
Chloride: 89 mmol/L — ABNORMAL LOW (ref 98–111)
GFR calc Af Amer: >60 mL/min (ref >60)
GFR calc non Af Amer: >60 mL/min (ref >60)
Glucose, Bld: 115 mg/dL — ABNORMAL HIGH (ref 70–99)
Potassium: 4.9 mmol/L (ref 3.5–5.1)
SODIUM: 122 mmol/L — AB (ref 135–145)

## 2017-10-26 LAB — I-STAT TROPONIN, ED: Troponin i, poc: 0 ng/mL (ref 0.00–0.08)

## 2017-10-26 MED ORDER — SODIUM CHLORIDE 0.9 % IV BOLUS
1000.0000 mL | Freq: Once | INTRAVENOUS | Status: AC
  Administered 2017-10-26: 1000 mL via INTRAVENOUS

## 2017-10-26 NOTE — ED Triage Notes (Signed)
Most recent medication change was increasing amlodipine dose to twice a day

## 2017-10-26 NOTE — ED Notes (Signed)
Ambulate in hall to bathroom

## 2017-10-26 NOTE — ED Provider Notes (Signed)
[facility] Provider Note   CSN: [id number] Arrival date & time: 10/26/17  1034     History   Chief Complaint Chief Complaint  Patient presents with  . Dizziness    HPI [facility] is a 84 y.o. female.  84 yo F with a chief complaint of dizziness.  To her that it feels like she is lightheaded.  Going on since yesterday.  Patient recently saw her cardiologist for bigeminy, at that time they felt that she was too bradycardic and decided to change her blood pressure medication.  She was switched from Coreg to amlodipine.  She thinks the medication is made her feel unwell.  She has not been eating and drinking today because she did not feel well.  Denies vertigo denies vomiting denies difficulty with ambulation.  Denies chest pain or shortness of breath denies diarrhea.  Denies fevers or chills.  Denies sick contacts.  The history is provided by the patient.  Illness  This is a new problem. The current episode started yesterday. The problem occurs constantly. The problem has not changed since onset.Pertinent negatives include no chest pain, no headaches and no shortness of breath. Nothing aggravates the symptoms. Nothing relieves the symptoms. She has tried nothing for the symptoms. The treatment provided no relief.    Past Medical History:  Diagnosis Date  . Arthritis   . High cholesterol   . Hypertension   . Hypothyroidism   . Osteopenia   . Syncope and collapse   . URI, acute 08-14-2013  . Wears glasses    read    Patient Active Problem List   Diagnosis Date Noted  . Mitral annular calcification 12/16/2015  . Essential hypertension 12/16/2015  . Bradycardia 12/16/2015  . Post-operative state 08/13/2013    Past Surgical History:  Procedure Laterality Date  . ABDOMINAL HYSTERECTOMY    . BACK SURGERY  2009   lumb lam  . DILATION AND CURETTAGE OF UTERUS    . EYE SURGERY     both cataracts 2013  . fractured femure  1988  .  HERNIA REPAIR  2008   left ingunial  . hip replacement left replacement 2009  2009   left total hip  . left heel    . MASS EXCISION N/A 07/18/2013   Procedure: EXCISION SEBACEOUS CYST SCALP;  Surgeon: [name]. Cornett, MD;  Location: [facility];  Service: General;  Laterality: N/A;  . TONSILLECTOMY    . TUBAL LIGATION       OB History   None      Home Medications    Prior to Admission medications   Medication Sig Start Date End Date Taking? Authorizing Provider  acetaminophen (TYLENOL) 500 MG tablet Take 500 mg by mouth every 6 (six) hours as needed for mild pain.    Yes [provider]  amLODipine (NORVASC) 2.5 MG tablet Take 2.5 mg by mouth 2 (two) times daily.  11/19/15  Yes [provider]  aspirin 81 MG tablet Take 81 mg by mouth every evening.    Yes [provider]  calcium-vitamin D (OSCAL WITH D) 500-200 MG-UNIT per tablet Take 1 tablet by mouth 2 (two) times daily.    Yes [provider]  carvedilol (COREG) 3.125 MG tablet Take 1.56 mg by mouth 2 (two) times daily with a meal.   Yes [provider]  clindamycin (CLEOCIN) 150 MG capsule Take 150 mg by mouth 3 (three) times daily. FOR 7  DAYS 10/19/17  Yes [provider]  cloNIDine (CATAPRES) 0.1 MG tablet Take 0.1 mg by mouth 2 (two) times daily.   Yes [provider]  ESTRACE VAGINAL 0.1 MG/GM vaginal cream Place 0.5 g vaginally 2 (two) times a week.  06/27/13  Yes [provider]  fluticasone (FLONASE) 50 MCG/ACT nasal spray Place 1 spray into both nostrils as needed for allergies.  09/22/15  Yes [provider]  levothyroxine (SYNTHROID, LEVOTHROID) 75 MCG tablet Take 75 mcg by mouth daily before breakfast.   Yes [provider]  linaclotide (LINZESS) 290 MCG CAPS capsule Take 290 mcg by mouth daily before breakfast.   Yes [provider]  magnesium citrate SOLN Take 0.5-1 Bottles by mouth once as needed for mild  constipation, moderate constipation or severe constipation.   Yes [provider]  montelukast (SINGULAIR) 10 MG tablet Take 10 mg by mouth every evening.  10/26/15  Yes [provider]  Multiple Vitamins-Minerals (MULTIVITAMIN WITH MINERALS) tablet Take 1 tablet by mouth every evening.    Yes [provider]  olmesartan (BENICAR) 20 MG tablet Take 20 mg by mouth 2 (two) times daily.  10/26/15  Yes [provider]  omeprazole (PRILOSEC OTC) 20 MG tablet Take 20 mg by mouth daily before breakfast.   Yes [provider]  Probiotic Product (PROBIOTIC & ACIDOPHILUS EX ST PO) Take 1 tablet by mouth every evening. Reported on 03/21/2015   Yes [provider]  simvastatin (ZOCOR) 20 MG tablet Take 20 mg by mouth every evening.   Yes [provider]  vitamin C (ASCORBIC ACID) 500 MG tablet Take 500 mg by mouth every evening.   Yes [provider]  zoledronic acid (RECLAST) 5 MG/100ML SOLN injection Inject 5 mg into the vein See admin instructions. Inject 5 mg in the vein once a year   Yes [provider]    Family History Family History  Problem Relation Age of Onset  . Ovarian cancer Mother 67  . Arthritis Mother        DJD  . Hypertension Mother   . Osteopenia Mother   . Heart attack Father   . Hypertension Father   . Diabetes Mellitus II Brother   . Hypertension Brother   . Hyperlipidemia Brother   . Lung cancer Brother 42  . Hypertension Brother     Social History Social History   Tobacco Use  . Smoking status: Never Smoker  . Smokeless tobacco: Never Used  Substance Use Topics  . Alcohol use: No  . Drug use: No     Allergies   Celebrex [celecoxib]; Doxycycline; Hctz [hydrochlorothiazide]; Levaquin [levofloxacin in d5w]; Sudafed [pseudoephedrine hcl]; Trazodone and nefazodone; Aleve [naproxen sodium]; Asa [aspirin]; Augmentin [amoxicillin-pot clavulanate]; Ceftin [cefuroxime axetil]; Ibuprofen; Keflex  [cephalexin]; Nitrofurantoin; Nsaids; Penicillins; Sulfa antibiotics; and Zithromax [azithromycin]   Review of Systems Review of Systems  Constitutional: Negative for chills and fever.  HENT: Negative for congestion and rhinorrhea.   Eyes: Negative for redness and visual disturbance.  Respiratory: Negative for shortness of breath and wheezing.   Cardiovascular: Negative for chest pain and palpitations.  Gastrointestinal: Negative for nausea and vomiting.  Genitourinary: Negative for dysuria and urgency.  Musculoskeletal: Negative for arthralgias and myalgias.  Skin: Negative for pallor and wound.  Neurological: Positive for dizziness and light-headedness. Negative for headaches.     Physical Exam Updated Vital Signs BP (!) 168/54   Pulse (!) 33   Temp 97.7 F (36.5 C) (Oral)  Resp 17   SpO2 98%   Physical Exam  Constitutional: She is oriented to person, place, and time. She appears well-developed and well-nourished. No distress.  HENT:  Head: Normocephalic and atraumatic.  Eyes: Pupils are equal, round, and reactive to light. EOM are normal.  Neck: Normal range of motion. Neck supple.  Cardiovascular: Normal rate and regular rhythm. Exam reveals no gallop and no friction rub.  No murmur heard. Pulmonary/Chest: Effort normal. She has no wheezes. She has no rales.  Abdominal: Soft. She exhibits no distension and no mass. There is no tenderness. There is no guarding.  Musculoskeletal: She exhibits no edema or tenderness.  Neurological: She is alert and oriented to person, place, and time.  Grossly neurologically intact  Skin: Skin is warm and dry. She is not diaphoretic.  Psychiatric: She has a normal mood and affect. Her behavior is normal.  Nursing note and vitals reviewed.    ED Treatments / Results  Labs (all labs ordered are listed, but only abnormal results are displayed) Labs Reviewed  BASIC METABOLIC PANEL - Abnormal; Notable for the following components:       Result Value   Sodium 122 (*)    Chloride 89 (*)    Glucose, Bld 115 (*)    All other components within normal limits  CBC - Abnormal; Notable for the following components:   Hemoglobin 15.3 (*)    All other components within normal limits  URINALYSIS, ROUTINE W REFLEX MICROSCOPIC - Abnormal; Notable for the following components:   Color, Urine COLORLESS (*)    Specific Gravity, Urine 1.002 (*)    Hgb urine dipstick MODERATE (*)    Ketones, ur 5 (*)    Bacteria, UA RARE (*)    All other components within normal limits  I-STAT TROPONIN, ED    EKG EKG Interpretation  Date/Time:  Thursday October 26 2017 10:45:10 EDT Ventricular Rate:  79 PR Interval:  170 QRS Duration: 84 QT Interval:  388 QTC Calculation: 444 R Axis:   30 Text Interpretation:  Sinus rhythm with frequent Premature ventricular complexes and Premature atrial complexes Possible Left atrial enlargement Anteroseptal infarct , age undetermined Abnormal ECG No significant change since last tracing Confirmed by Deno Etienne [phone]) on 10/26/2017 12:32:14 PM   Radiology Dg Chest 2 View  Result Date: 10/26/2017 CLINICAL DATA:  Weakness, being treated for a UTI, hyponatremia, bradycardia EXAM: CHEST - 2 VIEW COMPARISON:  09/23/2017 FINDINGS: Normal heart size, mediastinal contours, and pulmonary vascularity. Atherosclerotic calcification aorta. Lungs well inflated and clear. No acute infiltrate, pleural effusion or pneumothorax. Bones demineralized with scattered costal cartilaginous calcifications noted. IMPRESSION: No acute abnormalities. Electronically Signed   By: Lavonia Dana M.D.   On: 10/26/2017 14:05    Procedures Procedures (including critical care time)  Medications Ordered in ED Medications  sodium chloride 0.9 % bolus 1,000 mL (0 mLs Intravenous Stopped 10/26/17 1653)     Initial Impression / Assessment and Plan / ED Course  I have reviewed the triage vital signs and the nursing notes.  Pertinent labs & imaging  results that were available during my care of the patient were reviewed by me and considered in my medical decision making (see chart for details).     84 yo F with a chief complaint of feeling unwell.  Most likely this is secondary to dehydration based on her labs.  Her sodium is 122 and her chloride is 89.  She has had no intake at all today and had  very minimal yesterday per family.  We will give a fluid bolus UA negative for infection chest x-ray negative for focal infiltrate.  She is likely hemoconcentrated as her hemoglobin is now elevated compared to her baseline.  We will check a troponin for possible atypical presentation for MI.  Have the patient ambulate.  Troponin is negative.  Patient is able to ambulate without difficulty.  She is feeling much better after IV fluids.  She will discuss her current blood pressure management with her cardiologist.  Return for any worsening symptoms.  8:39 PM:  I have discussed the diagnosis/risks/treatment options with the patient and family and believe the pt to be eligible for discharge home to follow-up with PCP, Cards. We also discussed returning to the ED immediately if new or worsening sx occur. We discussed the sx which are most concerning (e.g., chest pain, sob, worsening fatigue) that necessitate immediate return. Medications administered to the patient during their visit and any new prescriptions provided to the patient are listed below.  Medications given during this visit Medications  sodium chloride 0.9 % bolus 1,000 mL (0 mLs Intravenous Stopped 10/26/17 1653)     The patient appears reasonably screen and/or stabilized for discharge and I doubt any other medical condition or other [facility] requiring further screening, evaluation, or treatment in the ED at this time prior to discharge.    Final Clinical Impressions(s) / ED Diagnoses   Final diagnoses:  Hyponatremia  Dehydration    ED Discharge Orders    None       Deno Etienne,  DO 10/26/17 2039

## 2017-10-26 NOTE — ED Triage Notes (Signed)
Pt in via EMS to triage c/o dizziness since 7a, woke up with her symptoms and just doesn't feel right, reports several medication changes recently and is unsure if that is the cause- apparently seen yesterday and was bradycardic and hyponatremic at 125 and is being treated for a UTI- HR for EMS in the 70s

## 2017-10-26 NOTE — Telephone Encounter (Signed)
New Message:      We received a referral through [facility] concerning this pt. I just called to make this appt and husband states she is currently in the ER. If possible the pt's spouse would like for her to be seen sooner. Pt is having persistent bradycardia, rate 35, HTN, still present post med adjustments. HYPERTENSION BRADYCARDIA(per [facility] notes)

## 2017-10-26 NOTE — Telephone Encounter (Signed)
Called and made patient's husband aware that the patient should continue receiving care in the ER and that we would reassess when f/u would be needed once the patient is out of the hospital. Husband verbalized understanding and thanked me for the call.

## 2017-10-26 NOTE — Discharge Instructions (Signed)
Call your doctor this afternoon.  See if they want you to start back on your old medications or continue your current ones.  Return to the emergency department for any worsening symptoms chest pain shortness of breath abdominal pain vomiting diarrhea.

## 2017-10-27 DIAGNOSIS — E871 Hypo-osmolality and hyponatremia: Secondary | ICD-10-CM | POA: Diagnosis not present

## 2017-10-27 DIAGNOSIS — I10 Essential (primary) hypertension: Secondary | ICD-10-CM | POA: Diagnosis not present

## 2017-10-27 DIAGNOSIS — Z681 Body mass index (BMI) 19 or less, adult: Secondary | ICD-10-CM | POA: Diagnosis not present

## 2017-10-27 DIAGNOSIS — F430 Acute stress reaction: Secondary | ICD-10-CM | POA: Diagnosis not present

## 2017-10-27 DIAGNOSIS — R001 Bradycardia, unspecified: Secondary | ICD-10-CM | POA: Diagnosis not present

## 2017-10-31 DIAGNOSIS — I10 Essential (primary) hypertension: Secondary | ICD-10-CM | POA: Diagnosis not present

## 2017-10-31 DIAGNOSIS — F430 Acute stress reaction: Secondary | ICD-10-CM | POA: Diagnosis not present

## 2017-10-31 DIAGNOSIS — E871 Hypo-osmolality and hyponatremia: Secondary | ICD-10-CM | POA: Diagnosis not present

## 2017-10-31 DIAGNOSIS — R001 Bradycardia, unspecified: Secondary | ICD-10-CM | POA: Diagnosis not present

## 2017-10-31 DIAGNOSIS — Z681 Body mass index (BMI) 19 or less, adult: Secondary | ICD-10-CM | POA: Diagnosis not present

## 2017-10-31 NOTE — Telephone Encounter (Signed)
-----   Message from Jettie Booze, MD sent at 10/31/2017  2:10 PM EDT ----- Can we order a 48 hr Holter.   Dx, fatigue, bradycardia  THanks.  JV

## 2017-10-31 NOTE — Telephone Encounter (Signed)
Attempted to contact patient several times, but the number on file keeps ringing busy. Will try again later.

## 2017-10-31 NOTE — Telephone Encounter (Signed)
Multiple attempts have been made to contact the patient, but the phone keeps ringing busy. Will try again at another time.

## 2017-11-01 NOTE — Telephone Encounter (Signed)
Reached out to patient again today. Let patient know that Dr. [name] would like to order 48-hour holter monitor for her for bradycardia and fatigue. Monitor appointment scheduled for 8/19 at 11:00 AM. Instructed patient to keep f/u appointment with Ermalinda Barrios, PA on 8/27 and let us know if her Sx change or worsen before then. Patient verbalized understanding and thanked me for the call.

## 2017-11-02 DIAGNOSIS — E871 Hypo-osmolality and hyponatremia: Secondary | ICD-10-CM | POA: Diagnosis not present

## 2017-11-06 ENCOUNTER — Ambulatory Visit (INDEPENDENT_AMBULATORY_CARE_PROVIDER_SITE_OTHER): Payer: Medicare Other

## 2017-11-06 DIAGNOSIS — R001 Bradycardia, unspecified: Secondary | ICD-10-CM | POA: Diagnosis not present

## 2017-11-06 DIAGNOSIS — E871 Hypo-osmolality and hyponatremia: Secondary | ICD-10-CM | POA: Diagnosis not present

## 2017-11-06 DIAGNOSIS — R5383 Other fatigue: Secondary | ICD-10-CM

## 2017-11-07 DIAGNOSIS — E871 Hypo-osmolality and hyponatremia: Secondary | ICD-10-CM | POA: Diagnosis not present

## 2017-11-07 DIAGNOSIS — I10 Essential (primary) hypertension: Secondary | ICD-10-CM | POA: Diagnosis not present

## 2017-11-07 DIAGNOSIS — R001 Bradycardia, unspecified: Secondary | ICD-10-CM | POA: Diagnosis not present

## 2017-11-07 DIAGNOSIS — K5900 Constipation, unspecified: Secondary | ICD-10-CM | POA: Diagnosis not present

## 2017-11-07 DIAGNOSIS — F430 Acute stress reaction: Secondary | ICD-10-CM | POA: Diagnosis not present

## 2017-11-13 DIAGNOSIS — I493 Ventricular premature depolarization: Secondary | ICD-10-CM | POA: Insufficient documentation

## 2017-11-13 DIAGNOSIS — E871 Hypo-osmolality and hyponatremia: Secondary | ICD-10-CM | POA: Diagnosis not present

## 2017-11-13 NOTE — Progress Notes (Signed)
Cardiology Office Note    Date:  11/14/2017   ID:  Shelley Cook, DOB 11-06-1929, MRN [id number]  PCP:  [name], MD  Cardiologist: [name], MD  No chief complaint on file.   History of Present Illness:  Shelley Cook is a 84 y.o. female with history of dyspnea and found to have severe MAC on echo in 2017.  Syncope in 01/2017 when getting up to go to the bathroom at 4 AM felt probably related to orthostasis.  History of hypertension, HLD  Last saw Dr. [name] 03/2017 at which time he wanted to change her blood pressure medicines but she wanted to talk to her PCP first.  He went to increase her amlodipine to 5 mg daily and decrease carvedilol 3.125 twice daily because of bradycardia at 51 bpm.  This was eventually stopped.  Patient went to the ER 09/23/2017 because of bradycardia.  She said her blood pressure cuff showed a heart rate of 35.  Patient was asymptomatic.  In the ER her heart rate was 87.  She was sent home.  Patient back in the ER with dizziness 10/26/2017 that patient thought was secondary to amlodipine.  Pulse initially was listed at 33 but she was having frequent PVCs and PACs.  EKG ventricular rate was 79.  Her sodium was 122 and she was felt to be dehydrated.  She was given IV fluids.    48-hour monitor 11/06/2017 and this showed normal sinus rhythm with frequent PVCs 37% of beats and a 3 beat run of PVCs with the longest.  Rare PACs.  2D echo was ordered but has not been done yet.  Patient comes in today accompanied by her daughter.  Dr. [name] has been checking her sodium regularly.  Was 129 last time was checked and she had a checked yesterday but does not have the results.  She has been limiting her water intake but trying to drink some other liquids.  2 weeks ago she got up to help her husband and says she passed out and just fell over but did not lose consciousness.  She did not have any warning sign.  No appetite.  Has lost 6 pounds since  January.   Past Medical History:  Diagnosis Date  . Arthritis   . High cholesterol   . Hypertension   . Hypothyroidism   . Osteopenia   . Syncope and collapse   . URI, acute 08-14-2013  . Wears glasses    read    Past Surgical History:  Procedure Laterality Date  . ABDOMINAL HYSTERECTOMY    . BACK SURGERY  2009   lumb lam  . DILATION AND CURETTAGE OF UTERUS    . EYE SURGERY     both cataracts 2013  . fractured femure  1988  . HERNIA REPAIR  2008   left ingunial  . hip replacement left replacement 2009  2009   left total hip  . left heel    . MASS EXCISION N/A 07/18/2013   Procedure: EXCISION SEBACEOUS CYST SCALP;  Surgeon: [name]. Cornett, MD;  Location: [facility];  Service: General;  Laterality: N/A;  . TONSILLECTOMY    . TUBAL LIGATION      Current Medications: Current Meds  Medication Sig  . acetaminophen (TYLENOL) 500 MG tablet Take 500 mg by mouth every 6 (six) hours as needed for mild pain.   [name] amLODipine (NORVASC) 2.5 MG tablet Take 2.5 mg by mouth 2 (two) times daily.   [name]  aspirin 81 MG tablet Take 81 mg by mouth every evening.   . calcium-vitamin D (OSCAL WITH D) 500-200 MG-UNIT per tablet Take 1 tablet by mouth 2 (two) times daily.   . cloNIDine (CATAPRES) 0.1 MG tablet Take 0.1 mg by mouth 2 (two) times daily.  [name] escitalopram (LEXAPRO) 5 MG tablet Take 5 mg by mouth daily.  [name] ESTRACE VAGINAL 0.1 MG/GM vaginal cream Place 0.5 g vaginally 2 (two) times a week.   . fluticasone (FLONASE) 50 MCG/ACT nasal spray Place 1 spray into both nostrils as needed for allergies.   [name] levothyroxine (SYNTHROID, LEVOTHROID) 75 MCG tablet Take 75 mcg by mouth daily before breakfast.  . linaclotide (LINZESS) 290 MCG CAPS capsule Take 290 mcg by mouth daily before breakfast.  . magnesium citrate SOLN Take 0.5-1 Bottles by mouth once as needed for mild constipation, moderate constipation or severe constipation.  . montelukast (SINGULAIR) 10 MG tablet Take 10 mg by  mouth every evening.   . Multiple Vitamins-Minerals (MULTIVITAMIN WITH MINERALS) tablet Take 1 tablet by mouth every evening.   . olmesartan (BENICAR) 20 MG tablet Take 20 mg by mouth 2 (two) times daily.   [name] omeprazole (PRILOSEC OTC) 20 MG tablet Take 20 mg by mouth daily before breakfast.  . Probiotic Product (PROBIOTIC & ACIDOPHILUS EX ST PO) Take 1 tablet by mouth every evening. Reported on 03/21/2015  . simvastatin (ZOCOR) 20 MG tablet Take 20 mg by mouth every evening.  . vitamin C (ASCORBIC ACID) 500 MG tablet Take 500 mg by mouth every evening.  . zoledronic acid (RECLAST) 5 MG/100ML SOLN injection Inject 5 mg into the vein See admin instructions. Inject 5 mg in the vein once a year     Allergies:   Celebrex [celecoxib]; Doxycycline; Hctz [hydrochlorothiazide]; Levaquin [levofloxacin in d5w]; Sudafed [pseudoephedrine hcl]; Trazodone and nefazodone; Aleve [naproxen sodium]; Asa [aspirin]; Augmentin [amoxicillin-pot clavulanate]; Ceftin [cefuroxime axetil]; Ibuprofen; Keflex [cephalexin]; Nitrofurantoin; Nsaids; Penicillins; Sulfa antibiotics; and Zithromax [azithromycin]   Social History   Socioeconomic History  . Marital status: Married    Spouse name: Not on file  . Number of children: Not on file  . Years of education: Not on file  . Highest education level: Not on file  Occupational History  . Not on file  Social Needs  . Financial resource strain: Not on file  . Food insecurity:    Worry: Not on file    Inability: Not on file  . Transportation needs:    Medical: Not on file    Non-medical: Not on file  Tobacco Use  . Smoking status: Never Smoker  . Smokeless tobacco: Never Used  Substance and Sexual Activity  . Alcohol use: No  . Drug use: No  . Sexual activity: Yes    Birth control/protection: Post-menopausal  Lifestyle  . Physical activity:    Days per week: Not on file    Minutes per session: Not on file  . Stress: Not on file  Relationships  . Social  connections:    Talks on phone: Not on file    Gets together: Not on file    Attends religious service: Not on file    Active member of club or organization: Not on file    Attends meetings of clubs or organizations: Not on file    Relationship status: Not on file  Other Topics Concern  . Not on file  Social History Narrative  . Not on file     Family History:  The  patient's family history includes Arthritis in her mother; Diabetes Mellitus II in her brother; Heart attack in her father; Hyperlipidemia in her brother; Hypertension in her brother, brother, father, and mother; Lung cancer (age of onset: 77) in her brother; Osteopenia in her mother; Ovarian cancer (age of onset: 64) in her mother.   ROS:   Please see the history of present illness.    Review of Systems  Constitution: Positive for decreased appetite.  HENT: Negative.   Eyes: Negative.   Cardiovascular: Negative.   Respiratory: Negative.   Hematologic/Lymphatic: Bruises/bleeds easily.  Musculoskeletal: Negative.  Negative for joint pain.  Gastrointestinal: Positive for constipation.  Genitourinary: Negative.   Neurological: Positive for dizziness and loss of balance.   All other systems reviewed and are negative.   PHYSICAL EXAM:   VS:  BP 122/64   Pulse 64   Ht 5' 5"  (1.651 m)   Wt 110 lb 12.8 oz (50.3 kg)   SpO2 97%   BMI 18.44 kg/m   Physical Exam  GEN: Well nourished, well developed, in no acute distress  HEENT: normal  Neck: no JVD, carotid bruits, or masses Cardiac:RRR; no murmurs, rubs, or gallops  Respiratory:  clear to auscultation bilaterally, normal work of breathing GI: soft, nontender, nondistended, + BS Ext: without cyanosis, clubbing, or edema, Good distal pulses bilaterally MS: no deformity or atrophy  Skin: warm and dry, no rash Neuro:  Alert and Oriented x 3, Strength and sensation are intact Psych: euthymic mood, full affect  Wt Readings from Last 3 Encounters:  11/14/17 110 lb 12.8  oz (50.3 kg)  09/23/17 114 lb (51.7 kg)  04/06/17 116 lb (52.6 kg)      Studies/Labs Reviewed:   EKG:  EKG is not ordered today.   Recent Labs: 09/23/2017: Magnesium 2.2 10/26/2017: BUN 9; Creatinine, Ser 0.73; Hemoglobin 15.3; Platelets 226; Potassium 4.9; Sodium 122   Lipid Panel No results found for: CHOL, TRIG, HDL, CHOLHDL, VLDL, LDLCALC, LDLDIRECT  Additional studies/ records that were reviewed today include:  Holter monitor 11/06/2017  Normal sinus rhythm with frequent PVCs (37% of beats). 3 beat run of PVCs was the longest.  Rare PACs.   Check LV function with echocardiogram.    2D echo 7/2017Study Conclusions   - Left ventricle: The cavity size was normal. Wall thickness was   increased in a pattern of moderate LVH. Systolic function was   normal. The estimated ejection fraction was in the range of 60%   to 65%. Wall motion was normal; there were no regional wall   motion abnormalities. The study is not technically sufficient to   allow evaluation of LV diastolic function. - Mitral valve: Severe MAC and small diastolic gradient across MV.   Severely calcified annulus. There was mild regurgitation. - Atrial septum: No defect or patent foramen ovale was identified. - Pulmonary arteries: PA peak pressure: 35 mm Hg (S).    ASSESSMENT:    1. Syncope, unspecified syncope type   2. Essential hypertension   3. Bradycardia   4. PVC's (premature ventricular contractions)   5. Hyponatremia      PLAN:  In order of problems listed above:  Syncope patient says she passed out when she got up quickly to attend her husband but she did not totally lose consciousness.  She had no control and fell down.  Has history of bradycardia and carvedilol was stopped.  Recent 48-hour monitor showed PVCs to be 37% of the time longest run was 3 beats.  Patient does not feel this.  Also has had hyponatremia and has been followed closely by Dr. Karl Bales.  Yesterday's sodium was 126.  Has lost 6  pounds since January.  Daughter is getting help at home as well as meals as patient is trying to take care of her husband with heart failure.  Essential hypertension-has been on clonidine and amlodipine. Now orthostatic.  Orthostatic hypotension- BP dropped from 135/80 to 90/60 standing. Will stop clonindine.  Decrease amlodipine to 2.5 mg once daily.  Can have orthostatics rechecked when she sees Dr. Lovena Le September 4 to see if we need to stop amlodipine.  Compression stockings.  History of bradycardia beta-blocker stopped.  48-hour monitor showed normal sinus rhythm with frequent PVCs 37%(67,000) of the time and a 48-hour.  3 beat run of PVCs with the longest run.  2D echo to assess LV function.  Discussed with Dr. [name] who recommends EP evaluation.  Hyponatremia-Na 122/129/126-yest. H2O limited to 3 cups/day but trying to increase other liquids   Medication Adjustments/Labs and Tests Ordered: Current medicines are reviewed at length with the patient today.  Concerns regarding medicines are outlined above.  Medication changes, Labs and Tests ordered today are listed in the Patient Instructions below. Patient Instructions  Medication Instructions:  Your physician recommends that you continue on your current medications as directed. Please refer to the Current Medication list given to you today.   Labwork: None ordered  Testing/Procedures: Your physician has requested that you have an echocardiogram. Echocardiography is a painless test that uses sound waves to create images of your heart. It provides your doctor with information about the size and shape of your heart and how well your heart's chambers and valves are working. This procedure takes approximately one hour. There are no restrictions for this procedure.    Follow-Up: Your physician recommends that you schedule a follow-up appointment in: WITH DR. VARANASI     Any Other Special Instructions Will Be Listed Below (If  Applicable).  Echocardiogram An echocardiogram, or echocardiography, uses sound waves (ultrasound) to produce an image of your heart. The echocardiogram is simple, painless, obtained within a short period of time, and offers valuable information to your health care provider. The images from an echocardiogram can provide information such as:  Evidence of coronary artery disease (CAD).  Heart size.  Heart muscle function.  Heart valve function.  Aneurysm detection.  Evidence of a past heart attack.  Fluid buildup around the heart.  Heart muscle thickening.  Assess heart valve function.  Tell a health care provider about:  Any allergies you have.  All medicines you are taking, including vitamins, herbs, eye drops, creams, and over-the-counter medicines.  Any problems you or family members have had with anesthetic medicines.  Any blood disorders you have.  Any surgeries you have had.  Any medical conditions you have.  Whether you are pregnant or may be pregnant. What happens before the procedure? No special preparation is needed. Eat and drink normally. What happens during the procedure?  In order to produce an image of your heart, gel will be applied to your chest and a wand-like tool (transducer) will be moved over your chest. The gel will help transmit the sound waves from the transducer. The sound waves will harmlessly bounce off your heart to allow the heart images to be captured in real-time motion. These images will then be recorded.  You may need an IV to receive a medicine that improves the quality of the pictures. What happens  after the procedure? You may return to your normal schedule including diet, activities, and medicines, unless your health care provider tells you otherwise. This information is not intended to replace advice given to you by your health care provider. Make sure you discuss any questions you have with your health care provider. Document  Released: 03/04/2000 Document Revised: 10/24/2015 Document Reviewed: 11/12/2012 Elsevier Interactive Patient Education  2017 [employer].    If you need a refill on your cardiac medications before your next appointment, please call your pharmacy.      Sumner Boast, PA-C  11/14/2017 9:32 AM    [facility] [place], [place], [place]  [address] Phone: [phone]; Fax: [phone]

## 2017-11-14 ENCOUNTER — Ambulatory Visit (INDEPENDENT_AMBULATORY_CARE_PROVIDER_SITE_OTHER): Payer: Medicare Other | Admitting: Physician Assistant

## 2017-11-14 ENCOUNTER — Encounter: Payer: Self-pay | Admitting: Physician Assistant

## 2017-11-14 VITALS — BP 122/64 | HR 64 | Ht 65.0 in | Wt 110.8 lb

## 2017-11-14 DIAGNOSIS — I951 Orthostatic hypotension: Secondary | ICD-10-CM | POA: Diagnosis not present

## 2017-11-14 DIAGNOSIS — I493 Ventricular premature depolarization: Secondary | ICD-10-CM | POA: Diagnosis not present

## 2017-11-14 DIAGNOSIS — R55 Syncope and collapse: Secondary | ICD-10-CM

## 2017-11-14 DIAGNOSIS — E871 Hypo-osmolality and hyponatremia: Secondary | ICD-10-CM

## 2017-11-14 DIAGNOSIS — R001 Bradycardia, unspecified: Secondary | ICD-10-CM | POA: Diagnosis not present

## 2017-11-14 DIAGNOSIS — I10 Essential (primary) hypertension: Secondary | ICD-10-CM | POA: Diagnosis not present

## 2017-11-14 DIAGNOSIS — Z681 Body mass index (BMI) 19 or less, adult: Secondary | ICD-10-CM | POA: Diagnosis not present

## 2017-11-14 DIAGNOSIS — K5900 Constipation, unspecified: Secondary | ICD-10-CM | POA: Diagnosis not present

## 2017-11-14 MED ORDER — AMLODIPINE BESYLATE 2.5 MG PO TABS: 2.5000 mg | ORAL_TABLET | Freq: Every day | ORAL | 3 refills | Status: AC

## 2017-11-14 NOTE — Patient Instructions (Addendum)
Medication Instructions:  Your physician has recommended you make the following change in your medication:  1.  DECREASE Amlodipine to 2.5 mg taking 1 tablet daily 2.  STOP Clonidine   Labwork: None ordered  Testing/Procedures: Your physician has requested that you have an echocardiogram. Echocardiography is a painless test that uses sound waves to create images of your heart. It provides your doctor with information about the size and shape of your heart and how well your heart's chambers and valves are working. This procedure takes approximately one hour. There are no restrictions for this procedure.    Follow-Up: You have been referred to DR. Lovena Le, EP, ARRIVE AT 2:15 11/22/17 (LOCATED AT THE SAME OFFICE AS DR. VARANASI)  Any Other Special Instructions Will Be Listed Below (If Applicable). You have been given a prescription for compression stockings.  Please get them and start wearing them.  Echocardiogram An echocardiogram, or echocardiography, uses sound waves (ultrasound) to produce an image of your heart. The echocardiogram is simple, painless, obtained within a short period of time, and offers valuable information to your health care provider. The images from an echocardiogram can provide information such as:  Evidence of coronary artery disease (CAD).  Heart size.  Heart muscle function.  Heart valve function.  Aneurysm detection.  Evidence of a past heart attack.  Fluid buildup around the heart.  Heart muscle thickening.  Assess heart valve function.  Tell a health care provider about:  Any allergies you have.  All medicines you are taking, including vitamins, herbs, eye drops, creams, and over-the-counter medicines.  Any problems you or family members have had with anesthetic medicines.  Any blood disorders you have.  Any surgeries you have had.  Any medical conditions you have.  Whether you are pregnant or may be pregnant. What happens before the  procedure? No special preparation is needed. Eat and drink normally. What happens during the procedure?  In order to produce an image of your heart, gel will be applied to your chest and a wand-like tool (transducer) will be moved over your chest. The gel will help transmit the sound waves from the transducer. The sound waves will harmlessly bounce off your heart to allow the heart images to be captured in real-time motion. These images will then be recorded.  You may need an IV to receive a medicine that improves the quality of the pictures. What happens after the procedure? You may return to your normal schedule including diet, activities, and medicines, unless your health care provider tells you otherwise. This information is not intended to replace advice given to you by your health care provider. Make sure you discuss any questions you have with your health care provider. Document Released: 03/04/2000 Document Revised: 10/24/2015 Document Reviewed: 11/12/2012 Elsevier Interactive Patient Education  2017 [employer].    If you need a refill on your cardiac medications before your next appointment, please call your pharmacy.

## 2017-11-21 ENCOUNTER — Ambulatory Visit (HOSPITAL_COMMUNITY): Payer: Medicare Other | Attending: Cardiovascular Disease

## 2017-11-21 ENCOUNTER — Other Ambulatory Visit: Payer: Self-pay

## 2017-11-21 ENCOUNTER — Other Ambulatory Visit: Payer: Self-pay | Admitting: Adult Health

## 2017-11-21 DIAGNOSIS — R001 Bradycardia, unspecified: Secondary | ICD-10-CM

## 2017-11-21 DIAGNOSIS — I493 Ventricular premature depolarization: Secondary | ICD-10-CM | POA: Diagnosis not present

## 2017-11-21 DIAGNOSIS — R55 Syncope and collapse: Secondary | ICD-10-CM | POA: Insufficient documentation

## 2017-11-21 DIAGNOSIS — I119 Hypertensive heart disease without heart failure: Secondary | ICD-10-CM | POA: Diagnosis not present

## 2017-11-21 DIAGNOSIS — R0600 Dyspnea, unspecified: Secondary | ICD-10-CM | POA: Insufficient documentation

## 2017-11-21 DIAGNOSIS — I081 Rheumatic disorders of both mitral and tricuspid valves: Secondary | ICD-10-CM | POA: Diagnosis not present

## 2017-11-21 MED ORDER — OLMESARTAN MEDOXOMIL 20 MG PO TABS: 20.0000 mg | ORAL_TABLET | Freq: Two times a day (BID) | ORAL | 3 refills | Status: DC

## 2017-11-21 NOTE — Telephone Encounter (Signed)
New message:        *STAT* If patient is at the pharmacy, call can be transferred to refill team.   1. Which medications need to be refilled? (please list name of each medication and dose if known) olmesartan (BENICAR) 20 MG tablet  2. Which pharmacy/location (including street and city if local pharmacy) is medication to be sent to?Take 20 mg by mouth 2 (two) times daily.   3. Do they need a 30 day or 90 day supply? [place]

## 2017-11-22 ENCOUNTER — Telehealth: Payer: Self-pay | Admitting: Internal Medicine

## 2017-11-22 ENCOUNTER — Encounter: Payer: Self-pay | Admitting: Internal Medicine

## 2017-11-22 ENCOUNTER — Ambulatory Visit (INDEPENDENT_AMBULATORY_CARE_PROVIDER_SITE_OTHER): Payer: Medicare Other | Admitting: Internal Medicine

## 2017-11-22 VITALS — BP 174/74 | HR 74 | Ht 65.0 in | Wt 111.8 lb

## 2017-11-22 DIAGNOSIS — E871 Hypo-osmolality and hyponatremia: Secondary | ICD-10-CM | POA: Diagnosis not present

## 2017-11-22 DIAGNOSIS — R001 Bradycardia, unspecified: Secondary | ICD-10-CM | POA: Diagnosis not present

## 2017-11-22 DIAGNOSIS — R55 Syncope and collapse: Secondary | ICD-10-CM | POA: Diagnosis not present

## 2017-11-22 DIAGNOSIS — I10 Essential (primary) hypertension: Secondary | ICD-10-CM | POA: Diagnosis not present

## 2017-11-22 DIAGNOSIS — I493 Ventricular premature depolarization: Secondary | ICD-10-CM

## 2017-11-22 MED ORDER — FLECAINIDE ACETATE 50 MG PO TABS: 50.0000 mg | ORAL_TABLET | Freq: Two times a day (BID) | ORAL | 11 refills | Status: AC

## 2017-11-22 MED ORDER — OLMESARTAN MEDOXOMIL 20 MG PO TABS: 20.0000 mg | ORAL_TABLET | Freq: Two times a day (BID) | ORAL | 3 refills | Status: AC

## 2017-11-22 NOTE — Telephone Encounter (Signed)
Returned call to pharmacist.  Advised ok to fill flecainide 50 mg bid- Pt to have f/u EKG 14 days after initiating medication.  Pharmacist indicates understanding.

## 2017-11-22 NOTE — Progress Notes (Signed)
HPI Mrs. Shelley Cook is referred today for evaluation of PVC"s. She is a pleasant elderly woman who has a h/o HTN and dyslipidemia. She has had syncope which is thought most likely due to orthostasis. Today her vitals were stable. She has had a low pulse due to PVC's in a bigeminal fashion. She wore a cardiac monitor which demonstrated over 35% PVC's. She does not have palpitations. Allergies  Allergen Reactions  . Celebrex [Celecoxib] Other (See Comments)    Pt. Felt out of this world  . Doxycycline Other (See Comments)    DIZZINESS, NAUSEA  . Hctz [Hydrochlorothiazide] Other (See Comments)    Severe dehydration  . Levaquin [Levofloxacin In D5w] Swelling  . Sudafed [Pseudoephedrine Hcl] Swelling  . Trazodone And Nefazodone Other (See Comments)    Caused excessive sedation  . Aleve [Naproxen Sodium] Itching, Swelling and Rash  . Asa [Aspirin] Itching, Swelling and Rash  . Augmentin [Amoxicillin-Pot Clavulanate] Itching, Swelling and Rash  . Ceftin [Cefuroxime Axetil] Itching, Swelling and Rash  . Ibuprofen Itching, Swelling and Rash  . Keflex [Cephalexin] Itching, Swelling and Rash  . Nitrofurantoin Swelling and Rash  . Nsaids Itching, Swelling and Rash  . Penicillins Itching, Swelling and Rash    Has patient had a PCN reaction causing immediate rash, facial/tongue/throat swelling, SOB or lightheadedness with hypotension: Yes Has patient had a PCN reaction causing severe rash involving mucus membranes or skin necrosis: No Has patient had a PCN reaction that required hospitalization: No Has patient had a PCN reaction occurring within the last 10 years: No If all of the above answers are "NO", then may proceed with Cephalosporin use.  . Sulfa Antibiotics Itching, Swelling and Rash  . Zithromax [Azithromycin] Itching, Swelling and Rash     Current Outpatient Medications  Medication Sig Dispense Refill  . acetaminophen (TYLENOL) 500 MG tablet Take 500 mg by mouth every 6 (six)  hours as needed for mild pain.     [name] amLODipine (NORVASC) 2.5 MG tablet Take 1 tablet (2.5 mg total) by mouth daily. 90 tablet 3  . aspirin 81 MG tablet Take 81 mg by mouth every evening.     . calcium-vitamin D (OSCAL WITH D) 500-200 MG-UNIT per tablet Take 1 tablet by mouth 2 (two) times daily.     [name] escitalopram (LEXAPRO) 5 MG tablet Take 5 mg by mouth daily.  11  . ESTRACE VAGINAL 0.1 MG/GM vaginal cream Place 0.5 g vaginally 2 (two) times a week.     . fluticasone (FLONASE) 50 MCG/ACT nasal spray Place 1 spray into both nostrils as needed for allergies.     [name] levothyroxine (SYNTHROID, LEVOTHROID) 75 MCG tablet Take 75 mcg by mouth daily before breakfast.    . linaclotide (LINZESS) 290 MCG CAPS capsule Take 290 mcg by mouth daily before breakfast.    . magnesium citrate SOLN Take 0.5-1 Bottles by mouth once as needed for mild constipation, moderate constipation or severe constipation.    . montelukast (SINGULAIR) 10 MG tablet Take 10 mg by mouth every evening.     . Multiple Vitamins-Minerals (MULTIVITAMIN WITH MINERALS) tablet Take 1 tablet by mouth every evening.     . olmesartan (BENICAR) 20 MG tablet Take 1 tablet (20 mg total) by mouth 2 (two) times daily. 180 tablet 3  . omeprazole (PRILOSEC OTC) 20 MG tablet Take 20 mg by mouth daily before breakfast.    . Probiotic Product (PROBIOTIC & ACIDOPHILUS EX ST PO) Take 1 tablet by  mouth every evening. Reported on 03/21/2015    . simvastatin (ZOCOR) 20 MG tablet Take 20 mg by mouth every evening.    . vitamin C (ASCORBIC ACID) 500 MG tablet Take 500 mg by mouth every evening.    . zoledronic acid (RECLAST) 5 MG/100ML SOLN injection Inject 5 mg into the vein See admin instructions. Inject 5 mg in the vein once a year     No current facility-administered medications for this visit.      Past Medical History:  Diagnosis Date  . Arthritis   . High cholesterol   . Hypertension   . Hypothyroidism   . Osteopenia   . Syncope and collapse     . URI, acute 08-14-2013  . Wears glasses    read    ROS:   All systems reviewed and negative except as noted in the HPI.   Past Surgical History:  Procedure Laterality Date  . ABDOMINAL HYSTERECTOMY    . BACK SURGERY  2009   lumb lam  . DILATION AND CURETTAGE OF UTERUS    . EYE SURGERY     both cataracts 2013  . fractured femure  1988  . HERNIA REPAIR  2008   left ingunial  . hip replacement left replacement 2009  2009   left total hip  . left heel    . MASS EXCISION N/A 07/18/2013   Procedure: EXCISION SEBACEOUS CYST SCALP;  Surgeon: [name]. Cornett, MD;  Location: [facility];  Service: General;  Laterality: N/A;  . TONSILLECTOMY    . TUBAL LIGATION       Family History  Problem Relation Age of Onset  . Ovarian cancer Mother 75  . Arthritis Mother        DJD  . Hypertension Mother   . Osteopenia Mother   . Heart attack Father   . Hypertension Father   . Diabetes Mellitus II Brother   . Hypertension Brother   . Hyperlipidemia Brother   . Lung cancer Brother 3  . Hypertension Brother      Social History   Socioeconomic History  . Marital status: Married    Spouse name: Not on file  . Number of children: Not on file  . Years of education: Not on file  . Highest education level: Not on file  Occupational History  . Not on file  Social Needs  . Financial resource strain: Not on file  . Food insecurity:    Worry: Not on file    Inability: Not on file  . Transportation needs:    Medical: Not on file    Non-medical: Not on file  Tobacco Use  . Smoking status: Never Smoker  . Smokeless tobacco: Never Used  Substance and Sexual Activity  . Alcohol use: No  . Drug use: No  . Sexual activity: Yes    Birth control/protection: Post-menopausal  Lifestyle  . Physical activity:    Days per week: Not on file    Minutes per session: Not on file  . Stress: Not on file  Relationships  . Social connections:    Talks on phone: Not on file     Gets together: Not on file    Attends religious service: Not on file    Active member of club or organization: Not on file    Attends meetings of clubs or organizations: Not on file    Relationship status: Not on file  . Intimate partner violence:    Fear of  current or ex partner: Not on file    Emotionally abused: Not on file    Physically abused: Not on file    Forced sexual activity: Not on file  Other Topics Concern  . Not on file  Social History Narrative  . Not on file     BP (!) 174/74   Pulse 74   Ht 5\' 5"  (1.651 m)   Wt 111 lb 12.8 oz (50.7 kg)   SpO2 98%   BMI 18.60 kg/m   Physical Exam:  Well appearing 84 yo woman, NAD HEENT: Unremarkable Neck:  6 cm JVD, no thyromegally Lymphatics:  No adenopathy Back:  No CVA tenderness Lungs:  Clear with no wheezes HEART:  Regular rate rhythm, no murmurs, no rubs, no clicks Abd:  soft, positive bowel sounds, no organomegally, no rebound, no guarding Ext:  2 plus pulses, no edema, no cyanosis, no clubbing Skin:  No rashes no nodules Neuro:  CN II through XII intact, motor grossly intact  EKG - reviewed NSR with PVC"s likely originating from the RVOT   Assess/Plan: 1. PVC's - she has periods of fatigue and weakness but no palpitations. I have recommended trying low dose flecainide to see if we can induce the density of her ventricular ectopy and to help her feel better. 2. HTN - her blood pressure is high today. However because of orthostasis, I have recommended she not aggressively try to lower the pressure.  3. Syncope - I considered an ILR but I think watchful waiting most likely to be appropriate.  Mikle Bosworth.D.

## 2017-11-22 NOTE — Patient Instructions (Addendum)
Medication Instructions:  Your physician has recommended you make the following change in your medication:  1.  Start taking flecainide 50 mg-  Take one tablet by mouth twice a day.  Labwork: You will get a BMP the same day as your EKG.  Testing/Procedures: Return to [facility] office in 1-2 weeks for an EKG. Please schedule for EKG.  Follow-Up: Your physician wants you to follow-up in: 6-8 weeks with Dr. Lovena Le.      Any Other Special Instructions Will Be Listed Below (If Applicable).  If you need a refill on your cardiac medications before your next appointment, please call your pharmacy.    Flecainide tablets What is this medicine? FLECAINIDE (FLEK a nide) is an antiarrhythmic drug. This medicine is used to prevent irregular heart rhythm. It can also slow down fast heartbeats called tachycardia. This medicine may be used for other purposes; ask your health care provider or pharmacist if you have questions. COMMON BRAND NAME(S): Tambocor What should I tell my health care provider before I take this medicine? They need to know if you have any of these conditions: -abnormal levels of potassium in the blood -heart disease including heart rhythm and heart rate problems -kidney or liver disease -recent heart attack -an unusual or allergic reaction to flecainide, local anesthetics, other medicines, foods, dyes, or preservatives -pregnant or trying to get pregnant -breast-feeding How should I use this medicine? Take this medicine by mouth with a glass of water. Follow the directions on the prescription label. You can take this medicine with or without food. Take your doses at regular intervals. Do not take your medicine more often than directed. Do not stop taking this medicine suddenly. This may cause serious, heart-related side effects. If your doctor wants you to stop the medicine, the dose may be slowly lowered over time to avoid any side effects. Talk to your pediatrician regarding  the use of this medicine in children. While this drug may be prescribed for children as young as 1 year of age for selected conditions, precautions do apply. Overdosage: If you think you have taken too much of this medicine contact a poison control center or emergency room at once. NOTE: This medicine is only for you. Do not share this medicine with others. What if I miss a dose? If you miss a dose, take it as soon as you can. If it is almost time for your next dose, take only that dose. Do not take double or extra doses. What may interact with this medicine? Do not take this medicine with any of the following medications: -amoxapine -arsenic trioxide -certain antibiotics like clarithromycin, erythromycin, gatifloxacin, gemifloxacin, levofloxacin, moxifloxacin, sparfloxacin, or troleandomycin -certain antidepressants called tricyclic antidepressants like amitriptyline, imipramine, or nortriptyline -certain medicines to control heart rhythm like disopyramide, dofetilide, encainide, moricizine, procainamide, propafenone, and quinidine -cisapride -cyclobenzaprine -delavirdine -droperidol -haloperidol -hawthorn -imatinib -levomethadyl -maprotiline -medicines for malaria like chloroquine and halofantrine -pentamidine -phenothiazines like chlorpromazine, mesoridazine, prochlorperazine, thioridazine -pimozide -quinine -ranolazine -ritonavir -sertindole -ziprasidone This medicine may also interact with the following medications: -cimetidine -medicines for angina or high blood pressure -medicines to control heart rhythm like amiodarone and digoxin This list may not describe all possible interactions. Give your health care provider a list of all the medicines, herbs, non-prescription drugs, or dietary supplements you use. Also tell them if you smoke, drink alcohol, or use illegal drugs. Some items may interact with your medicine. What should I watch for while using this medicine? Visit your  doctor or health  care professional for regular checks on your progress. Because your condition and the use of this medicine carries some risk, it is a good idea to carry an identification card, necklace or bracelet with details of your condition, medications and doctor or health care professional. Check your blood pressure and pulse rate regularly. Ask your health care professional what your blood pressure and pulse rate should be, and when you should contact him or her. Your doctor or health care professional also may schedule regular blood tests and electrocardiograms to check your progress. You may get drowsy or dizzy. Do not drive, use machinery, or do anything that needs mental alertness until you know how this medicine affects you. Do not stand or sit up quickly, especially if you are an older patient. This reduces the risk of dizzy or fainting spells. Alcohol can make you more dizzy, increase flushing and rapid heartbeats. Avoid alcoholic drinks. What side effects may I notice from receiving this medicine? Side effects that you should report to your doctor or health care professional as soon as possible: -chest pain, continued irregular heartbeats -difficulty breathing -swelling of the legs or feet -trembling, shaking -unusually weak or tired Side effects that usually do not require medical attention (report to your doctor or health care professional if they continue or are bothersome): -blurred vision -constipation -headache -nausea, vomiting -stomach pain This list may not describe all possible side effects. Call your doctor for medical advice about side effects. You may report side effects to FDA at 1-800-FDA-1088. Where should I keep my medicine? Keep out of the reach of children. Store at room temperature between 15 and 30 degrees C (59 and 86 degrees F). Protect from light. Keep container tightly closed. Throw away any unused medicine after the expiration date. NOTE: This sheet is a  summary. It may not cover all possible information. If you have questions about this medicine, talk to your doctor, pharmacist, or health care provider.  2018 [employer]vier/Gold Standard (2007-07-11 16:46:09)

## 2017-11-22 NOTE — Telephone Encounter (Signed)
New Message   Pt c/o medication issue:  1. Name of Medication: flecainide (TAMBOCOR) 50 MG tablet  2. How are you currently taking this medication (dosage and times per day)? Take 1 tablet (50 mg total) by mouth 2 (two) times daily  3. Are you having a reaction (difficulty breathing--STAT)? no  4. What is your medication issue? [facility] states that this medication will have a drug interaction with escitalopram (LEXAPRO) 5 MG tablet and want to make sure its ok to fill

## 2017-12-06 ENCOUNTER — Other Ambulatory Visit: Payer: Medicare Other | Admitting: *Deleted

## 2017-12-06 ENCOUNTER — Ambulatory Visit (INDEPENDENT_AMBULATORY_CARE_PROVIDER_SITE_OTHER): Payer: Medicare Other

## 2017-12-06 VITALS — BP 128/72 | HR 67 | Ht 65.0 in | Wt 107.0 lb

## 2017-12-06 DIAGNOSIS — I493 Ventricular premature depolarization: Secondary | ICD-10-CM | POA: Diagnosis not present

## 2017-12-06 DIAGNOSIS — R5383 Other fatigue: Secondary | ICD-10-CM | POA: Diagnosis not present

## 2017-12-06 DIAGNOSIS — R001 Bradycardia, unspecified: Secondary | ICD-10-CM | POA: Diagnosis not present

## 2017-12-06 NOTE — Progress Notes (Signed)
1.) Reason for visit: EKG for flecainide   2.) Name of MD requesting visit: Dr. Lovena Le  3.) H&P: HTN, dyslipidemia, PVCs   4.) ROS related to problem: Dizziness, weakness and syncope   5.) Assessment and plan per MD: The patient presented today feeling better than she did at her OV with Dr. Lovena Le. She still has some dizziness and weakness. Spoke with Dr. Lovena Le, he reviewed the EKG and stated to continue current therapy. Advised the patient if her fatigue and dizziness worsens contact the office. The patient expressed understanding and had no further questions.

## 2017-12-07 LAB — BASIC METABOLIC PANEL
BUN/Creatinine Ratio: 13 (ref 12–28)
BUN: 12 mg/dL (ref 8–27)
CHLORIDE: 84 mmol/L — AB (ref 96–106)
CO2: 24 mmol/L (ref 20–29)
Calcium: 9.3 mg/dL (ref 8.7–10.3)
Creatinine, Ser: 0.91 mg/dL (ref 0.57–1.00)
GFR, EST AFRICAN AMERICAN: 66 mL/min/{1.73_m2} (ref >59)
GFR, EST NON AFRICAN AMERICAN: 57 mL/min/{1.73_m2} — AB (ref >59)
Glucose: 87 mg/dL (ref 65–99)
POTASSIUM: 4.9 mmol/L (ref 3.5–5.2)
SODIUM: 122 mmol/L — AB (ref 134–144)

## 2017-12-12 DIAGNOSIS — I10 Essential (primary) hypertension: Secondary | ICD-10-CM | POA: Diagnosis not present

## 2017-12-12 DIAGNOSIS — S81811A Laceration without foreign body, right lower leg, initial encounter: Secondary | ICD-10-CM | POA: Diagnosis not present

## 2017-12-13 ENCOUNTER — Ambulatory Visit: Payer: Medicare Other | Admitting: Gastroenterology

## 2017-12-14 DIAGNOSIS — R58 Hemorrhage, not elsewhere classified: Secondary | ICD-10-CM | POA: Diagnosis not present

## 2017-12-14 DIAGNOSIS — Z5189 Encounter for other specified aftercare: Secondary | ICD-10-CM | POA: Diagnosis not present

## 2017-12-14 DIAGNOSIS — Z23 Encounter for immunization: Secondary | ICD-10-CM | POA: Diagnosis not present

## 2017-12-14 DIAGNOSIS — R001 Bradycardia, unspecified: Secondary | ICD-10-CM | POA: Diagnosis not present

## 2017-12-14 DIAGNOSIS — Z681 Body mass index (BMI) 19 or less, adult: Secondary | ICD-10-CM | POA: Diagnosis not present

## 2017-12-14 DIAGNOSIS — I10 Essential (primary) hypertension: Secondary | ICD-10-CM | POA: Diagnosis not present

## 2017-12-19 DIAGNOSIS — H6122 Impacted cerumen, left ear: Secondary | ICD-10-CM | POA: Diagnosis not present

## 2017-12-19 DIAGNOSIS — J370 Chronic laryngitis: Secondary | ICD-10-CM | POA: Diagnosis not present

## 2017-12-19 DIAGNOSIS — J320 Chronic maxillary sinusitis: Secondary | ICD-10-CM | POA: Diagnosis not present

## 2017-12-19 DIAGNOSIS — J322 Chronic ethmoidal sinusitis: Secondary | ICD-10-CM | POA: Diagnosis not present

## 2017-12-21 DIAGNOSIS — I872 Venous insufficiency (chronic) (peripheral): Secondary | ICD-10-CM | POA: Diagnosis not present

## 2017-12-21 DIAGNOSIS — I10 Essential (primary) hypertension: Secondary | ICD-10-CM | POA: Diagnosis not present

## 2017-12-21 DIAGNOSIS — M6281 Muscle weakness (generalized): Secondary | ICD-10-CM | POA: Diagnosis not present

## 2017-12-21 DIAGNOSIS — R269 Unspecified abnormalities of gait and mobility: Secondary | ICD-10-CM | POA: Diagnosis not present

## 2017-12-21 DIAGNOSIS — R531 Weakness: Secondary | ICD-10-CM | POA: Diagnosis not present

## 2017-12-21 DIAGNOSIS — E871 Hypo-osmolality and hyponatremia: Secondary | ICD-10-CM | POA: Diagnosis not present

## 2017-12-21 DIAGNOSIS — R296 Repeated falls: Secondary | ICD-10-CM | POA: Diagnosis not present

## 2017-12-21 DIAGNOSIS — F430 Acute stress reaction: Secondary | ICD-10-CM | POA: Diagnosis not present

## 2017-12-21 DIAGNOSIS — Z5189 Encounter for other specified aftercare: Secondary | ICD-10-CM | POA: Diagnosis not present

## 2017-12-21 DIAGNOSIS — Z789 Other specified health status: Secondary | ICD-10-CM | POA: Diagnosis not present

## 2017-12-21 DIAGNOSIS — R58 Hemorrhage, not elsewhere classified: Secondary | ICD-10-CM | POA: Diagnosis not present

## 2017-12-29 DIAGNOSIS — M1712 Unilateral primary osteoarthritis, left knee: Secondary | ICD-10-CM | POA: Diagnosis not present

## 2018-01-05 DIAGNOSIS — M1712 Unilateral primary osteoarthritis, left knee: Secondary | ICD-10-CM | POA: Diagnosis not present

## 2018-01-05 DIAGNOSIS — M1711 Unilateral primary osteoarthritis, right knee: Secondary | ICD-10-CM | POA: Diagnosis not present

## 2018-01-05 DIAGNOSIS — M25562 Pain in left knee: Secondary | ICD-10-CM | POA: Diagnosis not present

## 2018-01-10 ENCOUNTER — Ambulatory Visit: Payer: Medicare Other | Admitting: Internal Medicine

## 2018-01-12 DIAGNOSIS — M1712 Unilateral primary osteoarthritis, left knee: Secondary | ICD-10-CM | POA: Diagnosis not present

## 2018-01-16 ENCOUNTER — Encounter

## 2018-01-16 ENCOUNTER — Ambulatory Visit (INDEPENDENT_AMBULATORY_CARE_PROVIDER_SITE_OTHER): Payer: Medicare Other | Admitting: Gastroenterology

## 2018-01-16 ENCOUNTER — Encounter: Payer: Self-pay | Admitting: Gastroenterology

## 2018-01-16 ENCOUNTER — Other Ambulatory Visit (INDEPENDENT_AMBULATORY_CARE_PROVIDER_SITE_OTHER): Payer: Medicare Other

## 2018-01-16 VITALS — BP 148/76 | HR 72 | Ht 65.0 in | Wt 108.4 lb

## 2018-01-16 DIAGNOSIS — R935 Abnormal findings on diagnostic imaging of other abdominal regions, including retroperitoneum: Secondary | ICD-10-CM | POA: Diagnosis not present

## 2018-01-16 DIAGNOSIS — K5900 Constipation, unspecified: Secondary | ICD-10-CM

## 2018-01-16 LAB — TSH: TSH: 4.83 u[IU]/mL — AB (ref 0.35–4.50)

## 2018-01-16 NOTE — Progress Notes (Signed)
Referring Provider: [name], MD Primary Care Physician:  [name], MD   Reason for Consultation: Abdominal pain, constipation, reflux   IMPRESSION:  Acute on chronic constipation with sense of incomplete evaluation    - previously treated with stool softeners, Miralax, recently switched to Linzess 290 mcg daily Abnormal CT of the abdomen 06/20/17    - large stool burden Abnormal pancreas on CT 06/20/17    - pancreatic atrophy    - pancreatic neck macrolobulated cystic lesion measuring 1.4 cm the pancreatic duct measures 33 mm    - no family history of pancreatic cancer  Evaluate and treat constipation. My goal would be to manage her symptoms using psyllium and a stool softener. Miralax over time may be safer than Linzess given her advanced age.  Cystic pancreatic lesion. Plan MRI/MRCP when constipation has improved.   PLAN: TSH Increase water consumption Sitz Mark study Continue Linzess 290 mcg daily and stool softener QHS Add a daily stool bulking agent such as Benefiber  Return to the clinic following the [employer] study   HPI: Shelley Cook is a 84 y.o. female seen in consultation at the request of Dr. Dagmar Hait for a positive Cologuard.  The history is obtained through the patient and review of records provided by Dr. Dagmar Hait.  She has hypertension, venous insufficiency, hyperlipidemia, hypothyroidism, osteoporosis, hyponatremia and a history of a subdural intracranial hematoma December 2018. Recently seen by cardiology for PVCs and syncope that was thought to be due to orthostasis.   During her routine annual visit she noted a change in bowel habits she is tried to try to try MiraLAX.  Abdominal pain was evaluated with a CT scan. A CT of the abdomen and pelvis with contrast was obtained June 20, 2017 for epigastric pain.  This showed pancreatic atrophy.  There is a pancreatic neck macrolobulated cystic lesion measuring 1.4 cm the pancreatic duct measures 33 mm.   Gastric wall thickening was noted but thought to be due to underdistention.  There is a large amount of formed colonic stool.  Longstanding history of constipation. Used ExLax as a teenager. Previously using Filber bulking agents and stool softeners. Constipation worse starting in March. Started magnesium citrate. She has to strain with defecation. Sense of incomplete evacuation.  Hard stools. Increased water source. Doesn't feel a complete sense of evacuation. She has known hemorrhoids that have not been worse. One BM daily but it can be very difficult. No new medications. Continues on a stool softener at night.   Originally treated with MiraLAX but this was discontinued because her provider thought she didn't need a stool softener. Instead, she started Linzess in July 2019.  Linzess improves the urge but she still doesn't fully defecate. A Hemosure was negative 09/25/2017.  She has a long-standing history of gastroesophageal reflux disease well-controlled on as needed therapy.  Colonoscopy at age 35, age 84, and age 13. She had a polyp in her 89. Surveillance at age 59. No further colonoscopy since that time.   Under considerable stress taking care of her husband who is dying from heart failure.   Labs from 11/2017 show a calcium of 9.3. No TSH testing available.   Past Medical History:  Diagnosis Date  . Arthritis   . High cholesterol   . Hypertension   . Hypothyroidism   . Osteopenia   . Syncope and collapse   . URI, acute 08-14-2013  . Wears glasses    read    Past Surgical History:  Procedure Laterality Date  . ABDOMINAL HYSTERECTOMY    . BACK SURGERY  2009   lumb lam  . DILATION AND CURETTAGE OF UTERUS    . EYE SURGERY     both cataracts 2013  . fractured femure  1988  . HERNIA REPAIR  2008   left ingunial  . hip replacement left replacement 2009  2009   left total hip  . left heel    . MASS EXCISION N/A 07/18/2013   Procedure: EXCISION SEBACEOUS CYST SCALP;  Surgeon:  [name]. Cornett, MD;  Location: [facility];  Service: General;  Laterality: N/A;  . TONSILLECTOMY    . TUBAL LIGATION      Prior to Admission medications   Medication Sig Start Date End Date Taking? Authorizing Provider  acetaminophen (TYLENOL) 500 MG tablet Take 500 mg by mouth every 6 (six) hours as needed for mild pain.     [provider]  amLODipine (NORVASC) 2.5 MG tablet Take 1 tablet (2.5 mg total) by mouth daily. 11/14/17 02/12/18  Imogene Burn, PA-C  aspirin 81 MG tablet Take 81 mg by mouth every evening.     [provider]  calcium-vitamin D (OSCAL WITH D) 500-200 MG-UNIT per tablet Take 1 tablet by mouth 2 (two) times daily.     [provider]  escitalopram (LEXAPRO) 5 MG tablet Take 5 mg by mouth daily. 10/27/17   [provider]  ESTRACE VAGINAL 0.1 MG/GM vaginal cream Place 0.5 g vaginally 2 (two) times a week.  06/27/13   [provider]  flecainide (TAMBOCOR) 50 MG tablet Take 1 tablet (50 mg total) by mouth 2 (two) times daily. 11/22/17   Evans Lance, MD  fluticasone (FLONASE) 50 MCG/ACT nasal spray Place 1 spray into both nostrils as needed for allergies.  09/22/15   [provider]  levothyroxine (SYNTHROID, LEVOTHROID) 75 MCG tablet Take 75 mcg by mouth daily before breakfast.    [provider]  linaclotide (LINZESS) 290 MCG CAPS capsule Take 290 mcg by mouth daily before breakfast.    [provider]  magnesium citrate SOLN Take 0.5-1 Bottles by mouth once as needed for mild constipation, moderate constipation or severe constipation.    [provider]  Multiple Vitamins-Minerals (MULTIVITAMIN WITH MINERALS) tablet Take 1 tablet by mouth every evening.     [provider]  olmesartan (BENICAR) 20 MG tablet Take 1 tablet (20 mg total) by mouth 2 (two) times daily. 11/22/17   Evans Lance, MD  omeprazole (PRILOSEC OTC) 20 MG tablet Take 20 mg by mouth daily before  breakfast.    [provider]  Probiotic Product (PROBIOTIC & ACIDOPHILUS EX ST PO) Take 1 tablet by mouth every evening. Reported on 03/21/2015    [provider]  simvastatin (ZOCOR) 20 MG tablet Take 20 mg by mouth every evening.    [provider]  vitamin C (ASCORBIC ACID) 500 MG tablet Take 500 mg by mouth every evening.    [provider]  zoledronic acid (RECLAST) 5 MG/100ML SOLN injection Inject 5 mg into the vein See admin instructions. Inject 5 mg in the vein once a year    [provider]    Current Outpatient Medications  Medication Sig Dispense Refill  . acetaminophen (TYLENOL) 500 MG tablet Take 500 mg by mouth every 6 (six) hours as needed for mild pain.     [name] amLODipine (NORVASC) 2.5 MG tablet Take 1 tablet (2.5 mg  total) by mouth daily. 90 tablet 3  . aspirin 81 MG tablet Take 81 mg by mouth every evening.     . calcium-vitamin D (OSCAL WITH D) 500-200 MG-UNIT per tablet Take 1 tablet by mouth 2 (two) times daily.     [name] escitalopram (LEXAPRO) 5 MG tablet Take 5 mg by mouth daily.  11  . ESTRACE VAGINAL 0.1 MG/GM vaginal cream Place 0.5 g vaginally 2 (two) times a week.     . flecainide (TAMBOCOR) 50 MG tablet Take 1 tablet (50 mg total) by mouth 2 (two) times daily. 60 tablet 11  . fluticasone (FLONASE) 50 MCG/ACT nasal spray Place 1 spray into both nostrils as needed for allergies.     [name] levothyroxine (SYNTHROID, LEVOTHROID) 75 MCG tablet Take 75 mcg by mouth daily before breakfast.    . linaclotide (LINZESS) 290 MCG CAPS capsule Take 290 mcg by mouth daily before breakfast.    . magnesium citrate SOLN Take 0.5-1 Bottles by mouth once as needed for mild constipation, moderate constipation or severe constipation.    . Multiple Vitamins-Minerals (MULTIVITAMIN WITH MINERALS) tablet Take 1 tablet by mouth every evening.     . olmesartan (BENICAR) 20 MG tablet Take 1 tablet (20 mg total) by mouth 2 (two) times daily. 180 tablet 3  .  omeprazole (PRILOSEC OTC) 20 MG tablet Take 20 mg by mouth daily before breakfast.    . Probiotic Product (PROBIOTIC & ACIDOPHILUS EX ST PO) Take 1 tablet by mouth every evening. Reported on 03/21/2015    . simvastatin (ZOCOR) 20 MG tablet Take 20 mg by mouth every evening.    . vitamin C (ASCORBIC ACID) 500 MG tablet Take 500 mg by mouth every evening.    . zoledronic acid (RECLAST) 5 MG/100ML SOLN injection Inject 5 mg into the vein See admin instructions. Inject 5 mg in the vein once a year     No current facility-administered medications for this visit.     Allergies as of 01/16/2018 - Review Complete 12/06/2017  Allergen Reaction Noted  . Celebrex [celecoxib] Other (See Comments) 07/26/2012  . Doxycycline Other (See Comments) 12/16/2015  . Hctz [hydrochlorothiazide] Other (See Comments) 07/26/2012  . Levaquin [levofloxacin in d5w] Swelling 07/26/2012  . Sudafed [pseudoephedrine hcl] Swelling 07/26/2012  . Trazodone and nefazodone Other (See Comments) 10/26/2017  . Aleve [naproxen sodium] Itching, Swelling, and Rash 07/26/2012  . Asa [aspirin] Itching, Swelling, and Rash 07/26/2012  . Augmentin [amoxicillin-pot clavulanate] Itching, Swelling, and Rash 07/26/2012  . Ceftin [cefuroxime axetil] Itching, Swelling, and Rash 07/26/2012  . Ibuprofen Itching, Swelling, and Rash 07/26/2012  . Keflex [cephalexin] Itching, Swelling, and Rash 07/26/2012  . Nitrofurantoin Swelling and Rash 07/26/2012  . Nsaids Itching, Swelling, and Rash 07/26/2012  . Penicillins Itching, Swelling, and Rash 07/26/2012  . Sulfa antibiotics Itching, Swelling, and Rash 07/26/2012  . Zithromax [azithromycin] Itching, Swelling, and Rash 07/26/2012    Family History  Problem Relation Age of Onset  . Ovarian cancer Mother 9  . Arthritis Mother        DJD  . Hypertension Mother   . Osteopenia Mother   . Heart attack Father   . Hypertension Father   . Diabetes Mellitus II Brother   . Hypertension Brother   .  Hyperlipidemia Brother   . Lung cancer Brother 79  . Hypertension Brother     Social History   Socioeconomic History  . Marital status: Married    Spouse name: Not on file  . Number  of children: Not on file  . Years of education: Not on file  . Highest education level: Not on file  Occupational History  . Not on file  Social Needs  . Financial resource strain: Not on file  . Food insecurity:    Worry: Not on file    Inability: Not on file  . Transportation needs:    Medical: Not on file    Non-medical: Not on file  Tobacco Use  . Smoking status: Never Smoker  . Smokeless tobacco: Never Used  Substance and Sexual Activity  . Alcohol use: No  . Drug use: No  . Sexual activity: Yes    Birth control/protection: Post-menopausal  Lifestyle  . Physical activity:    Days per week: Not on file    Minutes per session: Not on file  . Stress: Not on file  Relationships  . Social connections:    Talks on phone: Not on file    Gets together: Not on file    Attends religious service: Not on file    Active member of club or organization: Not on file    Attends meetings of clubs or organizations: Not on file    Relationship status: Not on file  . Intimate partner violence:    Fear of current or ex partner: Not on file    Emotionally abused: Not on file    Physically abused: Not on file    Forced sexual activity: Not on file  Other Topics Concern  . Not on file  Social History Narrative  . Not on file    Review of Systems: 12 system ROS is negative except as noted above.   Physical Exam: Vital signs were reviewed. General:   Alert, well-nourished, pleasant and cooperative in NAD. Appears younger than her stated age.  Head:  Normocephalic and atraumatic. Bandaid on the left cheek.  Eyes:  Sclera clear, no icterus.   Conjunctiva pink. Mouth:  No deformity or lesions.   Neck:  Supple; no thyromegaly. Lungs:  Clear throughout to auscultation.   No wheezes.  Heart:  Regular  rate and rhythm; no murmurs Abdomen:  Soft, nontender, normal bowel sounds. No rebound or guarding. No hepatosplenomegaly Rectal:  Deferred  Msk:  Symmetrical without gross deformities. Extremities:  No gross deformities or edema. Neurologic:  Alert and  oriented x4;  grossly nonfocal Skin:  No rash or bruise. Psych:  Alert and cooperative. Normal mood and affect.   Leah Skora L. Tarri Glenn Md, MPH [facility] 01/16/2018, 1:38 PM

## 2018-01-16 NOTE — Patient Instructions (Signed)
Your provider has requested that you go to the basement level for lab work before leaving today. Press "B" on the elevator. The lab is located at the first door on the left as you exit the elevator.  Continue Linzess 290 mcg daily and a stool softener at bedtime.   Start Miralax 17 mg daily.   Return to clinic for the sitz marker study on a Monday, Thursday or Friday.

## 2018-01-18 ENCOUNTER — Encounter: Payer: Self-pay | Admitting: Internal Medicine

## 2018-01-18 ENCOUNTER — Ambulatory Visit (INDEPENDENT_AMBULATORY_CARE_PROVIDER_SITE_OTHER): Payer: Medicare Other | Admitting: Internal Medicine

## 2018-01-18 ENCOUNTER — Ambulatory Visit: Payer: Medicare Other | Admitting: Internal Medicine

## 2018-01-18 VITALS — BP 138/82 | HR 64 | Ht 65.0 in | Wt 110.4 lb

## 2018-01-18 DIAGNOSIS — R55 Syncope and collapse: Secondary | ICD-10-CM | POA: Diagnosis not present

## 2018-01-18 DIAGNOSIS — I10 Essential (primary) hypertension: Secondary | ICD-10-CM

## 2018-01-18 DIAGNOSIS — R001 Bradycardia, unspecified: Secondary | ICD-10-CM

## 2018-01-18 DIAGNOSIS — I493 Ventricular premature depolarization: Secondary | ICD-10-CM | POA: Diagnosis not present

## 2018-01-18 DIAGNOSIS — E038 Other specified hypothyroidism: Secondary | ICD-10-CM | POA: Diagnosis not present

## 2018-01-18 NOTE — Patient Instructions (Signed)
Medication Instructions:  Your physician recommends that you continue on your current medications as directed. Please refer to the Current Medication list given to you today.  If you need a refill on your cardiac medications before your next appointment, please call your pharmacy.   Lab work: None ordered.  If you have labs (blood work) drawn today and your tests are completely normal, you will receive your results only by: [name] MyChart Message (if you have MyChart) OR . A paper copy in the mail If you have any lab test that is abnormal or we need to change your treatment, we will call you to review the results.  Testing/Procedures: None ordered.  Follow-Up:  Your physician wants you to follow-up in: 6 months with Dr. Lovena Le.    You will receive a reminder letter in the mail two months in advance. If you don't receive a letter, please call our office to schedule the follow-up appointment.  At [facility], you and your health needs are our priority.  As part of our continuing mission to provide you with exceptional heart care, we have created designated Provider Care Teams.  These Care Teams include your primary Cardiologist (physician) and Advanced Practice Providers (APPs -  Physician Assistants and Nurse Practitioners) who all work together to provide you with the care you need, when you need it. Dennis Bast may see Cristopher Peru, MD or one of the following Advanced Practice Providers on your designated Care Team:   . Chanetta Marshall, NP . Tommye Standard, PA-C   Any Other Special Instructions Will Be Listed Below (If Applicable).

## 2018-01-18 NOTE — Progress Notes (Signed)
HPI Mrs. Beeghly returns today for followup of her PVC's. She is a pleasant 84 yo woman who I saw a couple of months ago with symptomatic and very dense PVC's. She was treated with low dose flecainide. In the interim, she has done well. Her palpitations have resolved. She feels well and is back to her usual. Allergies  Allergen Reactions  . Celebrex [Celecoxib] Other (See Comments)    Pt. Felt out of this world  . Doxycycline Other (See Comments)    DIZZINESS, NAUSEA  . Hctz [Hydrochlorothiazide] Other (See Comments)    Severe dehydration  . Levaquin [Levofloxacin In D5w] Swelling  . Sudafed [Pseudoephedrine Hcl] Swelling  . Trazodone And Nefazodone Other (See Comments)    Caused excessive sedation  . Aleve [Naproxen Sodium] Itching, Swelling and Rash  . Asa [Aspirin] Itching, Swelling and Rash  . Augmentin [Amoxicillin-Pot Clavulanate] Itching, Swelling and Rash  . Ceftin [Cefuroxime Axetil] Itching, Swelling and Rash  . Ibuprofen Itching, Swelling and Rash  . Keflex [Cephalexin] Itching, Swelling and Rash  . Nitrofurantoin Swelling and Rash  . Nsaids Itching, Swelling and Rash  . Penicillins Itching, Swelling and Rash    Has patient had a PCN reaction causing immediate rash, facial/tongue/throat swelling, SOB or lightheadedness with hypotension: Yes Has patient had a PCN reaction causing severe rash involving mucus membranes or skin necrosis: No Has patient had a PCN reaction that required hospitalization: No Has patient had a PCN reaction occurring within the last 10 years: No If all of the above answers are "NO", then may proceed with Cephalosporin use.  . Sulfa Antibiotics Itching, Swelling and Rash  . Zithromax [Azithromycin] Itching, Swelling and Rash     Current Outpatient Medications  Medication Sig Dispense Refill  . acetaminophen (TYLENOL) 500 MG tablet Take 500 mg by mouth every 6 (six) hours as needed for mild pain.     [name] amLODipine (NORVASC) 2.5 MG tablet  Take 1 tablet (2.5 mg total) by mouth daily. 90 tablet 3  . aspirin 81 MG tablet Take 81 mg by mouth every evening.     . calcium-vitamin D (OSCAL WITH D) 500-200 MG-UNIT per tablet Take 1 tablet by mouth 2 (two) times daily.     [name] escitalopram (LEXAPRO) 5 MG tablet Take 5 mg by mouth daily.  11  . ESTRACE VAGINAL 0.1 MG/GM vaginal cream Place 0.5 g vaginally 2 (two) times a week.     . flecainide (TAMBOCOR) 50 MG tablet Take 1 tablet (50 mg total) by mouth 2 (two) times daily. 60 tablet 11  . fluticasone (FLONASE) 50 MCG/ACT nasal spray Place 1 spray into both nostrils as needed for allergies.     [name] levothyroxine (SYNTHROID, LEVOTHROID) 75 MCG tablet Take 75 mcg by mouth daily before breakfast.    . linaclotide (LINZESS) 290 MCG CAPS capsule Take 290 mcg by mouth daily before breakfast.    . magnesium citrate SOLN Take 0.5-1 Bottles by mouth once as needed for mild constipation, moderate constipation or severe constipation.    . Multiple Vitamins-Minerals (MULTIVITAMIN WITH MINERALS) tablet Take 1 tablet by mouth every evening.     . olmesartan (BENICAR) 20 MG tablet Take 1 tablet (20 mg total) by mouth 2 (two) times daily. 180 tablet 3  . omeprazole (PRILOSEC OTC) 20 MG tablet Take 20 mg by mouth daily before breakfast.    . Probiotic Product (PROBIOTIC & ACIDOPHILUS EX ST PO) Take 1 tablet by mouth every evening. Reported on  03/21/2015    . simvastatin (ZOCOR) 20 MG tablet Take 20 mg by mouth every evening.    . vitamin C (ASCORBIC ACID) 500 MG tablet Take 500 mg by mouth every evening.    . zoledronic acid (RECLAST) 5 MG/100ML SOLN injection Inject 5 mg into the vein See admin instructions. Inject 5 mg in the vein once a year     No current facility-administered medications for this visit.      Past Medical History:  Diagnosis Date  . Arthritis   . High cholesterol   . Hypertension   . Hypothyroidism   . Osteopenia   . Syncope and collapse   . URI, acute 08-14-2013  . Wears glasses     read    ROS:   All systems reviewed and negative except as noted in the HPI.   Past Surgical History:  Procedure Laterality Date  . ABDOMINAL HYSTERECTOMY    . BACK SURGERY  2009   lumb lam  . DILATION AND CURETTAGE OF UTERUS    . EYE SURGERY     both cataracts 2013  . fractured femure  1988  . HERNIA REPAIR  2008   left ingunial  . hip replacement left replacement 2009  2009   left total hip  . left heel    . MASS EXCISION N/A 07/18/2013   Procedure: EXCISION SEBACEOUS CYST SCALP;  Surgeon: [name]. Cornett, MD;  Location: [facility];  Service: General;  Laterality: N/A;  . TONSILLECTOMY    . TUBAL LIGATION       Family History  Problem Relation Age of Onset  . Ovarian cancer Mother 76  . Arthritis Mother        DJD  . Hypertension Mother   . Osteopenia Mother   . Heart attack Father   . Hypertension Father   . Diabetes Mellitus II Brother   . Hypertension Brother   . Hyperlipidemia Brother   . Lung cancer Brother 16  . Hypertension Brother      Social History   Socioeconomic History  . Marital status: Married    Spouse name: Not on file  . Number of children: 2  . Years of education: Not on file  . Highest education level: Not on file  Occupational History  . Not on file  Social Needs  . Financial resource strain: Not on file  . Food insecurity:    Worry: Not on file    Inability: Not on file  . Transportation needs:    Medical: Not on file    Non-medical: Not on file  Tobacco Use  . Smoking status: Never Smoker  . Smokeless tobacco: Never Used  Substance and Sexual Activity  . Alcohol use: No  . Drug use: No  . Sexual activity: Yes    Birth control/protection: Post-menopausal  Lifestyle  . Physical activity:    Days per week: Not on file    Minutes per session: Not on file  . Stress: Not on file  Relationships  . Social connections:    Talks on phone: Not on file    Gets together: Not on file    Attends religious  service: Not on file    Active member of club or organization: Not on file    Attends meetings of clubs or organizations: Not on file    Relationship status: Not on file  . Intimate partner violence:    Fear of current or ex partner: Not on file  Emotionally abused: Not on file    Physically abused: Not on file    Forced sexual activity: Not on file  Other Topics Concern  . Not on file  Social History Narrative  . Not on file     BP 138/82   Pulse 64   Ht 5\' 5"  (1.651 m)   Wt 110 lb 6.4 oz (50.1 kg)   SpO2 99%   BMI 18.37 kg/m   Physical Exam:  Well appearing NAD HEENT: Unremarkable Neck:  No JVD, no thyromegally Lymphatics:  No adenopathy Back:  No CVA tenderness Lungs:  Clear with no wheezes HEART:  Regular rate rhythm, no murmurs, no rubs, no clicks Abd:  soft, positive bowel sounds, no organomegally, no rebound, no guarding Ext:  2 plus pulses, no edema, no cyanosis, no clubbing Skin:  No rashes no nodules Neuro:  CN II through XII intact, motor grossly intact  EKG - nsr  DEVICE  Normal device function.  See PaceArt for details.   Assess/Plan: 1. PVC's - she has had complete resolution of her symptoms on low dose flecainide. She will continue her current meds. Her ECG looks good.  2. HTN - her blood pressure has been reasonably well controlled. At her age we will continue as we are. 3. Syncope - she has had no symptoms. She will undergo watchful waiting.  Mikle Bosworth.D.

## 2018-01-19 DIAGNOSIS — M25562 Pain in left knee: Secondary | ICD-10-CM | POA: Diagnosis not present

## 2018-01-19 DIAGNOSIS — M1712 Unilateral primary osteoarthritis, left knee: Secondary | ICD-10-CM | POA: Diagnosis not present

## 2018-01-24 ENCOUNTER — Ambulatory Visit (INDEPENDENT_AMBULATORY_CARE_PROVIDER_SITE_OTHER)
Admission: RE | Admit: 2018-01-24 | Discharge: 2018-01-24 | Disposition: A | Discharge status: Home / Self Care | Payer: Medicare Other | Source: Ambulatory Visit | Attending: Gastroenterology | Admitting: Gastroenterology

## 2018-01-24 ENCOUNTER — Other Ambulatory Visit: Payer: Self-pay

## 2018-01-24 DIAGNOSIS — K5900 Constipation, unspecified: Secondary | ICD-10-CM

## 2018-01-26 DIAGNOSIS — M1712 Unilateral primary osteoarthritis, left knee: Secondary | ICD-10-CM | POA: Diagnosis not present

## 2018-01-29 ENCOUNTER — Telehealth: Payer: Self-pay | Admitting: Gastroenterology

## 2018-01-29 ENCOUNTER — Other Ambulatory Visit: Payer: Self-pay

## 2018-01-29 NOTE — Telephone Encounter (Signed)
Patient requesting to speak with the nurse regarding how she is taking her medication and if she needs to do anything different. Patient states she goes back and forth to the bathroom and wants to know if this is normal.

## 2018-01-29 NOTE — Telephone Encounter (Signed)
Called patient back, no answer and unable to lvm.

## 2018-01-30 ENCOUNTER — Emergency Department (HOSPITAL_COMMUNITY): Payer: Medicare Other

## 2018-01-30 ENCOUNTER — Inpatient Hospital Stay (HOSPITAL_COMMUNITY)
Admission: EM | Admit: 2018-01-30 | Discharge: 2018-02-18 | DRG: 064 | Disposition: E | Payer: Medicare Other | Attending: Neurology | Admitting: Neurology

## 2018-01-30 DIAGNOSIS — I10 Essential (primary) hypertension: Secondary | ICD-10-CM | POA: Diagnosis present

## 2018-01-30 DIAGNOSIS — M1990 Unspecified osteoarthritis, unspecified site: Secondary | ICD-10-CM | POA: Diagnosis present

## 2018-01-30 DIAGNOSIS — I611 Nontraumatic intracerebral hemorrhage in hemisphere, cortical: Secondary | ICD-10-CM | POA: Diagnosis not present

## 2018-01-30 DIAGNOSIS — R404 Transient alteration of awareness: Secondary | ICD-10-CM | POA: Diagnosis not present

## 2018-01-30 DIAGNOSIS — G459 Transient cerebral ischemic attack, unspecified: Secondary | ICD-10-CM | POA: Diagnosis not present

## 2018-01-30 DIAGNOSIS — Z66 Do not resuscitate: Secondary | ICD-10-CM | POA: Diagnosis present

## 2018-01-30 DIAGNOSIS — Z90710 Acquired absence of both cervix and uterus: Secondary | ICD-10-CM

## 2018-01-30 DIAGNOSIS — E039 Hypothyroidism, unspecified: Secondary | ICD-10-CM | POA: Diagnosis present

## 2018-01-30 DIAGNOSIS — G936 Cerebral edema: Secondary | ICD-10-CM | POA: Diagnosis present

## 2018-01-30 DIAGNOSIS — G935 Compression of brain: Secondary | ICD-10-CM | POA: Diagnosis present

## 2018-01-30 DIAGNOSIS — R0603 Acute respiratory distress: Secondary | ICD-10-CM | POA: Diagnosis not present

## 2018-01-30 DIAGNOSIS — I619 Nontraumatic intracerebral hemorrhage, unspecified: Secondary | ICD-10-CM | POA: Diagnosis present

## 2018-01-30 DIAGNOSIS — R29810 Facial weakness: Secondary | ICD-10-CM | POA: Diagnosis not present

## 2018-01-30 DIAGNOSIS — Z882 Allergy status to sulfonamides status: Secondary | ICD-10-CM

## 2018-01-30 DIAGNOSIS — Z7951 Long term (current) use of inhaled steroids: Secondary | ICD-10-CM

## 2018-01-30 DIAGNOSIS — R4701 Aphasia: Secondary | ICD-10-CM | POA: Diagnosis present

## 2018-01-30 DIAGNOSIS — Z881 Allergy status to other antibiotic agents status: Secondary | ICD-10-CM

## 2018-01-30 DIAGNOSIS — Z8041 Family history of malignant neoplasm of ovary: Secondary | ICD-10-CM

## 2018-01-30 DIAGNOSIS — Z96642 Presence of left artificial hip joint: Secondary | ICD-10-CM | POA: Diagnosis present

## 2018-01-30 DIAGNOSIS — Z515 Encounter for palliative care: Secondary | ICD-10-CM | POA: Diagnosis not present

## 2018-01-30 DIAGNOSIS — Z79899 Other long term (current) drug therapy: Secondary | ICD-10-CM

## 2018-01-30 DIAGNOSIS — G8191 Hemiplegia, unspecified affecting right dominant side: Secondary | ICD-10-CM | POA: Diagnosis present

## 2018-01-30 DIAGNOSIS — Z87440 Personal history of urinary (tract) infections: Secondary | ICD-10-CM

## 2018-01-30 DIAGNOSIS — R64 Cachexia: Secondary | ICD-10-CM | POA: Diagnosis present

## 2018-01-30 DIAGNOSIS — Z681 Body mass index (BMI) 19 or less, adult: Secondary | ICD-10-CM | POA: Diagnosis not present

## 2018-01-30 DIAGNOSIS — M8580 Other specified disorders of bone density and structure, unspecified site: Secondary | ICD-10-CM | POA: Diagnosis present

## 2018-01-30 DIAGNOSIS — Z888 Allergy status to other drugs, medicaments and biological substances status: Secondary | ICD-10-CM

## 2018-01-30 DIAGNOSIS — Z801 Family history of malignant neoplasm of trachea, bronchus and lung: Secondary | ICD-10-CM

## 2018-01-30 DIAGNOSIS — I629 Nontraumatic intracranial hemorrhage, unspecified: Secondary | ICD-10-CM | POA: Diagnosis not present

## 2018-01-30 DIAGNOSIS — Z7982 Long term (current) use of aspirin: Secondary | ICD-10-CM

## 2018-01-30 DIAGNOSIS — E785 Hyperlipidemia, unspecified: Secondary | ICD-10-CM | POA: Diagnosis present

## 2018-01-30 DIAGNOSIS — Z79890 Hormone replacement therapy (postmenopausal): Secondary | ICD-10-CM | POA: Diagnosis not present

## 2018-01-30 DIAGNOSIS — E870 Hyperosmolality and hypernatremia: Secondary | ICD-10-CM | POA: Diagnosis not present

## 2018-01-30 DIAGNOSIS — R4189 Other symptoms and signs involving cognitive functions and awareness: Secondary | ICD-10-CM

## 2018-01-30 DIAGNOSIS — I672 Cerebral atherosclerosis: Secondary | ICD-10-CM | POA: Diagnosis present

## 2018-01-30 DIAGNOSIS — I612 Nontraumatic intracerebral hemorrhage in hemisphere, unspecified: Secondary | ICD-10-CM

## 2018-01-30 DIAGNOSIS — Z8349 Family history of other endocrine, nutritional and metabolic diseases: Secondary | ICD-10-CM

## 2018-01-30 DIAGNOSIS — Z9851 Tubal ligation status: Secondary | ICD-10-CM

## 2018-01-30 DIAGNOSIS — I610 Nontraumatic intracerebral hemorrhage in hemisphere, subcortical: Secondary | ICD-10-CM | POA: Diagnosis not present

## 2018-01-30 DIAGNOSIS — Z8261 Family history of arthritis: Secondary | ICD-10-CM

## 2018-01-30 DIAGNOSIS — R410 Disorientation, unspecified: Secondary | ICD-10-CM | POA: Diagnosis not present

## 2018-01-30 DIAGNOSIS — E871 Hypo-osmolality and hyponatremia: Secondary | ICD-10-CM | POA: Diagnosis present

## 2018-01-30 DIAGNOSIS — R29720 NIHSS score 20: Secondary | ICD-10-CM | POA: Diagnosis present

## 2018-01-30 DIAGNOSIS — R4781 Slurred speech: Secondary | ICD-10-CM | POA: Diagnosis not present

## 2018-01-30 DIAGNOSIS — Z8249 Family history of ischemic heart disease and other diseases of the circulatory system: Secondary | ICD-10-CM

## 2018-01-30 LAB — DIFFERENTIAL
ABS IMMATURE GRANULOCYTES: 0.01 10*3/uL (ref 0.00–0.07)
BASOS PCT: 1 %
Basophils Absolute: 0.1 10*3/uL (ref 0.0–0.1)
Eosinophils Absolute: 0.2 10*3/uL (ref 0.0–0.5)
Eosinophils Relative: 3 %
Immature Granulocytes: 0 %
Lymphocytes Relative: 18 %
Lymphs Abs: 1.4 10*3/uL (ref 0.7–4.0)
Monocytes Absolute: 0.9 10*3/uL (ref 0.1–1.0)
Monocytes Relative: 12 %
NEUTROS ABS: 5 10*3/uL (ref 1.7–7.7)
NEUTROS PCT: 66 %

## 2018-01-30 LAB — I-STAT CHEM 8, ED
BUN: 14 mg/dL (ref 8–23)
CHLORIDE: 93 mmol/L — AB (ref 98–111)
Calcium, Ion: 0.91 mmol/L — ABNORMAL LOW (ref 1.15–1.40)
Creatinine, Ser: 0.7 mg/dL (ref 0.44–1.00)
GLUCOSE: 128 mg/dL — AB (ref 70–99)
HEMATOCRIT: 46 % (ref 36.0–46.0)
Hemoglobin: 15.6 g/dL — ABNORMAL HIGH (ref 12.0–15.0)
POTASSIUM: 5.2 mmol/L — AB (ref 3.5–5.1)
Sodium: 123 mmol/L — ABNORMAL LOW (ref 135–145)
TCO2: 25 mmol/L (ref 22–32)

## 2018-01-30 LAB — COMPREHENSIVE METABOLIC PANEL
ALT: 12 U/L (ref 0–44)
AST: 41 U/L (ref 15–41)
Albumin: 3.9 g/dL (ref 3.5–5.0)
Alkaline Phosphatase: 64 U/L (ref 38–126)
Anion gap: 12 (ref 5–15)
BUN: 11 mg/dL (ref 8–23)
CHLORIDE: 92 mmol/L — AB (ref 98–111)
CO2: 22 mmol/L (ref 22–32)
CREATININE: 0.75 mg/dL (ref 0.44–1.00)
Calcium: 8.9 mg/dL (ref 8.9–10.3)
GFR calc non Af Amer: >60 mL/min (ref >60)
Glucose, Bld: 129 mg/dL — ABNORMAL HIGH (ref 70–99)
POTASSIUM: 4.4 mmol/L (ref 3.5–5.1)
SODIUM: 126 mmol/L — AB (ref 135–145)
Total Bilirubin: 1.3 mg/dL — ABNORMAL HIGH (ref 0.3–1.2)
Total Protein: 6.5 g/dL (ref 6.5–8.1)

## 2018-01-30 LAB — CBC
HCT: 41.2 % (ref 36.0–46.0)
HEMOGLOBIN: 14.4 g/dL (ref 12.0–15.0)
MCH: 32.7 pg (ref 26.0–34.0)
MCHC: 35 g/dL (ref 30.0–36.0)
MCV: 93.4 fL (ref 80.0–100.0)
NRBC: 0 % (ref 0.0–0.2)
Platelets: 269 10*3/uL (ref 150–400)
RBC: 4.41 MIL/uL (ref 3.87–5.11)
RDW: 11.7 % (ref 11.5–15.5)
WBC: 7.7 10*3/uL (ref 4.0–10.5)

## 2018-01-30 LAB — PROTIME-INR
INR: 1.28
Prothrombin Time: 15.9 seconds — ABNORMAL HIGH (ref 11.4–15.2)

## 2018-01-30 LAB — I-STAT TROPONIN, ED: Troponin i, poc: 0.02 ng/mL (ref 0.00–0.08)

## 2018-01-30 LAB — APTT: aPTT: 26 seconds (ref 24–36)

## 2018-01-30 MED ORDER — ONDANSETRON HCL 4 MG/2ML IJ SOLN
INTRAMUSCULAR | Status: AC
  Administered 2018-01-30: 4 mg
  Filled 2018-01-30: qty 2

## 2018-01-30 MED ORDER — HALOPERIDOL LACTATE 5 MG/ML IJ SOLN
1.0000 mg | Freq: Once | INTRAMUSCULAR | Status: AC
  Administered 2018-01-30: 1 mg via INTRAVENOUS
  Filled 2018-01-30: qty 1

## 2018-01-30 MED ORDER — HALOPERIDOL LACTATE 5 MG/ML IJ SOLN
0.5000 mg | Freq: Once | INTRAMUSCULAR | Status: AC
  Administered 2018-01-30: 0.5 mg via INTRAVENOUS
  Filled 2018-01-30: qty 1

## 2018-01-30 MED ORDER — CLEVIDIPINE BUTYRATE 0.5 MG/ML IV EMUL
INTRAVENOUS | Status: AC
  Filled 2018-01-30: qty 50

## 2018-01-30 MED ORDER — PANTOPRAZOLE SODIUM 40 MG IV SOLR
40.0000 mg | Freq: Every day | INTRAVENOUS | Status: DC
  Administered 2018-01-30: 40 mg via INTRAVENOUS
  Filled 2018-01-30: qty 40

## 2018-01-30 MED ORDER — STROKE: EARLY STAGES OF RECOVERY BOOK
Freq: Once | Status: DC
  Filled 2018-01-30: qty 1

## 2018-01-30 MED ORDER — CLEVIDIPINE BUTYRATE 0.5 MG/ML IV EMUL
0.0000 mg/h | INTRAVENOUS | Status: DC
  Administered 2018-01-30: 1 mg/h via INTRAVENOUS
  Administered 2018-01-30: 4 mg/h via INTRAVENOUS
  Administered 2018-01-30: 1 mg/h via INTRAVENOUS
  Administered 2018-01-30: 2 mg/h via INTRAVENOUS
  Administered 2018-01-31: 5 mg/h via INTRAVENOUS
  Filled 2018-01-30 (×2): qty 50

## 2018-01-30 MED ORDER — SENNOSIDES-DOCUSATE SODIUM 8.6-50 MG PO TABS: 1.0000 | ORAL_TABLET | Freq: Two times a day (BID) | ORAL | Status: DC

## 2018-01-30 NOTE — H&P (Signed)
Admission H&P    Chief Complaint: code stroke HPI:   Shelley Cook is a 84 y.o. female with a past medical history of hypothyroid, hypertension, hyperlipidemia, who presents today as a code stroke.  Her last seen normal was at 1620 by her caregiver who noticed that she was acting abnormal at 1630.  Patient is unable to contribute to history.  EMS reports that patient is ambulatory and fully functional at baseline.I subsequently confirm this with the patient's daughter who stated that she had last spoken to the patient at 4 PM and she was fine. Patient does have history of hypertension and apparently takes her medication regularly and blood pressure is not poorly controlled. However EMS describe the bed pressure to be about 200 and was found to be 190/110 (. She was taken for a stat CT scan of the head which showed a large  6.4 x 3.4 x 6.1 cm -65 cubic cc volumeleft parieto-occipital parenchymal hemorrhage.there is no associated edema intraventricular and subarachnoid extension.  LSN: 4:20 PM on 11/12/19tPA Given: due to hemorrhage ICH score 3  Past Medical History:  Diagnosis Date  . Arthritis   . High cholesterol   . Hypertension   . Hypothyroidism   . Osteopenia   . Syncope and collapse   . URI, acute 08-14-2013  . Wears glasses    read    Past Surgical History:  Procedure Laterality Date  . ABDOMINAL HYSTERECTOMY    . BACK SURGERY  2009   lumb lam  . DILATION AND CURETTAGE OF UTERUS    . EYE SURGERY     both cataracts 2013  . fractured femure  1988  . HERNIA REPAIR  2008   left ingunial  . hip replacement left replacement 2009  2009   left total hip  . left heel    . MASS EXCISION N/A 07/18/2013   Procedure: EXCISION SEBACEOUS CYST SCALP;  Surgeon: [name]. Cornett, MD;  Location: [place];  Service: General;  Laterality: N/A;  . TONSILLECTOMY    . TUBAL LIGATION      Family History  Problem Relation Age of Onset  . Ovarian cancer Mother 35  .  Arthritis Mother        DJD  . Hypertension Mother   . Osteopenia Mother   . Heart attack Father   . Hypertension Father   . Diabetes Mellitus II Brother   . Hypertension Brother   . Hyperlipidemia Brother   . Lung cancer Brother 36  . Hypertension Brother    Social History:  reports that she has never smoked. She has never used smokeless tobacco. She reports that she does not drink alcohol or use drugs.  Allergies:  Allergies  Allergen Reactions  . Celebrex [Celecoxib] Other (See Comments)    Pt. Felt out of this world  . Doxycycline Other (See Comments)    DIZZINESS, NAUSEA  . Hctz [Hydrochlorothiazide] Other (See Comments)    Severe dehydration  . Levaquin [Levofloxacin In D5w] Swelling  . Sudafed [Pseudoephedrine Hcl] Swelling  . Trazodone And Nefazodone Other (See Comments)    Caused excessive sedation  . Aleve [Naproxen Sodium] Itching, Swelling and Rash  . Asa [Aspirin] Itching, Swelling and Rash  . Augmentin [Amoxicillin-Pot Clavulanate] Itching, Swelling and Rash  . Ceftin [Cefuroxime Axetil] Itching, Swelling and Rash  . Ibuprofen Itching, Swelling and Rash  . Keflex [Cephalexin] Itching, Swelling and Rash  . Nitrofurantoin Swelling and Rash  . Nsaids Itching, Swelling and Rash  .  Penicillins Itching, Swelling and Rash    Has patient had a PCN reaction causing immediate rash, facial/tongue/throat swelling, SOB or lightheadedness with hypotension: Yes Has patient had a PCN reaction causing severe rash involving mucus membranes or skin necrosis: No Has patient had a PCN reaction that required hospitalization: No Has patient had a PCN reaction occurring within the last 10 years: No If all of the above answers are "NO", then may proceed with Cephalosporin use.  . Sulfa Antibiotics Itching, Swelling and Rash  . Zithromax [Azithromycin] Itching, Swelling and Rash     (Not in a hospital admission)  ROS: 14 system review of systems not obtained due to the patient's  condition but has documented in history of present illness  Physical Examination: Height 5\' 5"  (1.651 m), weight 49.6 kg.   frail cachectic looking elderly Caucasian lady not in distress. Appears quite anxious and restless. . Afebrile. Head is nontraumatic. Neck is supple without bruit.    Cardiac exam no murmur or gallop. Lungs are clear to auscultation. Distal pulses are well felt.  Neurologic Examination:  awake alert globally aphasic. Makes a few guttural sounds and nonsensical words. Not following even simple commands. She has left gaze deviation unable to look to the right even up to midline. She blinks to threat on the left but not on the right. Right lower facial weakness. Tongue midline. Right hemiplegia 2/5 strength in the right upper and lower extremity. Purposeful antigravity strength on the left side. Deep tendon reflexes are depressed on the right and normal on the left. Right plantar upgoing left downgoing. She withdraws to painful stimuli on the right. Gait not tested. NIH stroke scale score 20 Results for orders placed or performed during the hospital encounter of 02/12/2018 (from the past 48 hour(s))  CBC     Status: None   Collection Time: 01/23/2018  5:16 PM  Result Value Ref Range   WBC 7.7 4.0 - 10.5 K/uL   RBC 4.41 3.87 - 5.11 MIL/uL   Hemoglobin 14.4 12.0 - 15.0 g/dL   HCT 41.2 36.0 - 46.0 %   MCV 93.4 80.0 - 100.0 fL   MCH 32.7 26.0 - 34.0 pg   MCHC 35.0 30.0 - 36.0 g/dL   RDW 11.7 11.5 - 15.5 %   Platelets 269 150 - 400 K/uL   nRBC 0.0 0.0 - 0.2 %    Comment: Performed at [facility], [place] [address], [place], [place] [address]  Differential     Status: None   Collection Time: 02/16/2018  5:16 PM  Result Value Ref Range   Neutrophils Relative % 66 %   Neutro Abs 5.0 1.7 - 7.7 K/uL   Lymphocytes Relative 18 %   Lymphs Abs 1.4 0.7 - 4.0 K/uL   Monocytes Relative 12 %   Monocytes Absolute 0.9 0.1 - 1.0 K/uL   Eosinophils Relative 3 %   Eosinophils Absolute  0.2 0.0 - 0.5 K/uL   Basophils Relative 1 %   Basophils Absolute 0.1 0.0 - 0.1 K/uL   Immature Granulocytes 0 %   Abs Immature Granulocytes 0.01 0.00 - 0.07 K/uL    Comment: Performed at [facility] [address] [place], [place] [address]  I-stat troponin, ED     Status: None   Collection Time: 01/20/2018  5:26 PM  Result Value Ref Range   Troponin i, poc 0.02 0.00 - 0.08 ng/mL   Comment 3  Comment: Due to the release kinetics of cTnI, a negative result within the first hours of the onset of symptoms does not rule out myocardial infarction with certainty. If myocardial infarction is still suspected, repeat the test at appropriate intervals.   I-Stat Chem 8, ED     Status: Abnormal   Collection Time: 02/12/2018  5:28 PM  Result Value Ref Range   Sodium 123 (L) 135 - 145 mmol/L   Potassium 5.2 (H) 3.5 - 5.1 mmol/L   Chloride 93 (L) 98 - 111 mmol/L   BUN 14 8 - 23 mg/dL   Creatinine, Ser 0.70 0.44 - 1.00 mg/dL   Glucose, Bld 128 (H) 70 - 99 mg/dL   Calcium, Ion 0.91 (L) 1.15 - 1.40 mmol/L   TCO2 25 22 - 32 mmol/L   Hemoglobin 15.6 (H) 12.0 - 15.0 g/dL   HCT 46.0 36.0 - 46.0 %   No results found.  Assessment: 84 y.o. female  With sudden onset of global aphasia and right hemiplegia due to large left posterior parenchymal intracerebral hemorrhage etiology indeterminate hypertensive versus amyloid angiopathy There is mild cytotoxic edema but no significant intraventricular hemorrhage or hydrocephalus Stroke Risk Factors - hypertension an age  Plan: Admit to neurological intensive care unit. Strict blood pressure control with systolic blood pressure goal below 140 for 24 hours and then 160 later. Start IV nicardipine drip.close neurological monitoring. Repeat CT scan of the head with CT angiogram tomorrow morning. I had a long discussion with the patient's daughter at the bedside regarding her prognosis and plan for treatment and answered questions. Resume home  medications when patient is able to swallow safely. Discussed with Dr. Cathleen Fears ER physician This patient is critically ill and at significant risk of neurological worsening, death and care requires constant monitoring of vital signs, hemodynamics,respiratory and cardiac monitoring, extensive review of multiple databases, frequent neurological assessment, discussion with family, other specialists and medical decision making of high complexity.I have made any additions or clarifications directly to the above note.This critical care time does not reflect procedure time, or teaching time or supervisory time of PA/NP/Med Resident etc but could involve care discussion time.  I spent 60 minutes of neurocritical care time  in the care of  this patient. Antony Contras, MD Medical Director [facility] Pager: [phone] 01/28/2018 6:04 PM      01/31/2018, 5:54 PM

## 2018-01-30 NOTE — Telephone Encounter (Signed)
Patient advised to stop Miralax and stool softener, adjust with Miralax as needed. Continue with Linzess.

## 2018-01-30 NOTE — ED Notes (Signed)
Paged Sethi/Willis on call, to [place], Therapist, sports

## 2018-01-30 NOTE — Telephone Encounter (Signed)
I glad that it's working but I'm sorry that it's working so well! I think she could stop the Miralax and stool softener and see if the Linzess controls everything with fewer total medications needed. Thank you.

## 2018-01-30 NOTE — ED Notes (Signed)
Paged Leonie Man to [place], RN

## 2018-01-30 NOTE — ED Notes (Signed)
After giving haldo 1mg . Pt calm and able to get bp. BP 148/56. Started cleviprex per neurologist

## 2018-01-30 NOTE — ED Provider Notes (Signed)
Level 5 caveat acuity of situation.  Patient had witnessed change in mental status 4:30 PM today.  She was called as a code stroke in the field.  On exam she does not answer questions.  Does not follow simple commands..  She has a left-sided gaze and is ignoring her right side.  Dr Leonie Man was here upon patient's arrival.  And will disposition patient to neuro intensive care unit.   Orlie Dakin, MD 01/25/2018 1757

## 2018-01-30 NOTE — Telephone Encounter (Signed)
Spoke to patient today, she is taking Linzess 290 mcg in the morning, also Miralax once a day and an OTC stool softener at night. She is now having multiple loose stools throughout the day and wanted to know if she should adjust any of her medications? Thanks.

## 2018-01-30 NOTE — ED Provider Notes (Signed)
[facility] EMERGENCY DEPARTMENT Provider Note   CSN: [id number] Arrival date & time: 02/03/2018  1714     History   Chief Complaint No chief complaint on file.   HPI Shelley Cook is a 84 y.o. female with a past medical history of hypothyroid, hypertension, hyperlipidemia, who presents today as a code stroke.  Her last seen normal was at 1620 by family members who noticed that she was acting abnormal at 1630.  Patient is unable to contribute to history.  EMS reports that patient is ambulatory and fully functional at baseline.     HPI  Past Medical History:  Diagnosis Date  . Arthritis   . High cholesterol   . Hypertension   . Hypothyroidism   . Osteopenia   . Syncope and collapse   . URI, acute 08-14-2013  . Wears glasses    read    Patient Active Problem List   Diagnosis Date Noted  . Syncope 11/14/2017  . Hyponatremia 11/14/2017  . Orthostatic hypotension 11/14/2017  . PVC's (premature ventricular contractions) 11/13/2017  . Mitral annular calcification 12/16/2015  . Essential hypertension 12/16/2015  . Bradycardia 12/16/2015  . Post-operative state 08/13/2013    Past Surgical History:  Procedure Laterality Date  . ABDOMINAL HYSTERECTOMY    . BACK SURGERY  2009   lumb lam  . DILATION AND CURETTAGE OF UTERUS    . EYE SURGERY     both cataracts 2013  . fractured femure  1988  . HERNIA REPAIR  2008   left ingunial  . hip replacement left replacement 2009  2009   left total hip  . left heel    . MASS EXCISION N/A 07/18/2013   Procedure: EXCISION SEBACEOUS CYST SCALP;  Surgeon: [name]. Cornett, MD;  Location: [facility];  Service: General;  Laterality: N/A;  . TONSILLECTOMY    . TUBAL LIGATION       OB History   None      Home Medications    Prior to Admission medications   Medication Sig Start Date End Date Taking? Authorizing Provider  acetaminophen (TYLENOL) 500 MG tablet Take 500 mg by mouth every 6 (six)  hours as needed for mild pain.     [provider]  amLODipine (NORVASC) 2.5 MG tablet Take 1 tablet (2.5 mg total) by mouth daily. 11/14/17 02/12/18  Imogene Burn, PA-C  aspirin 81 MG tablet Take 81 mg by mouth every evening.     [provider]  calcium-vitamin D (OSCAL WITH D) 500-200 MG-UNIT per tablet Take 1 tablet by mouth 2 (two) times daily.     [provider]  escitalopram (LEXAPRO) 5 MG tablet Take 5 mg by mouth daily. 10/27/17   [provider]  ESTRACE VAGINAL 0.1 MG/GM vaginal cream Place 0.5 g vaginally 2 (two) times a week.  06/27/13   [provider]  flecainide (TAMBOCOR) 50 MG tablet Take 1 tablet (50 mg total) by mouth 2 (two) times daily. 11/22/17   Evans Lance, MD  fluticasone (FLONASE) 50 MCG/ACT nasal spray Place 1 spray into both nostrils as needed for allergies.  09/22/15   [provider]  levothyroxine (SYNTHROID, LEVOTHROID) 75 MCG tablet Take 75 mcg by mouth daily before breakfast.    [provider]  linaclotide (LINZESS) 290 MCG CAPS capsule Take 290 mcg by mouth daily before breakfast.    [provider]  magnesium citrate SOLN Take 0.5-1 Bottles by mouth once as  needed for mild constipation, moderate constipation or severe constipation.    [provider]  Multiple Vitamins-Minerals (MULTIVITAMIN WITH MINERALS) tablet Take 1 tablet by mouth every evening.     [provider]  olmesartan (BENICAR) 20 MG tablet Take 1 tablet (20 mg total) by mouth 2 (two) times daily. 11/22/17   Evans Lance, MD  omeprazole (PRILOSEC OTC) 20 MG tablet Take 20 mg by mouth daily before breakfast.    [provider]  Probiotic Product (PROBIOTIC & ACIDOPHILUS EX ST PO) Take 1 tablet by mouth every evening. Reported on 03/21/2015    [provider]  simvastatin (ZOCOR) 20 MG tablet Take 20 mg by mouth every evening.    [provider]  vitamin C (ASCORBIC ACID) 500 MG tablet  Take 500 mg by mouth every evening.    [provider]  zoledronic acid (RECLAST) 5 MG/100ML SOLN injection Inject 5 mg into the vein See admin instructions. Inject 5 mg in the vein once a year    [provider]    Family History Family History  Problem Relation Age of Onset  . Ovarian cancer Mother 10  . Arthritis Mother        DJD  . Hypertension Mother   . Osteopenia Mother   . Heart attack Father   . Hypertension Father   . Diabetes Mellitus II Brother   . Hypertension Brother   . Hyperlipidemia Brother   . Lung cancer Brother 49  . Hypertension Brother     Social History Social History   Tobacco Use  . Smoking status: Never Smoker  . Smokeless tobacco: Never Used  Substance Use Topics  . Alcohol use: No  . Drug use: No     Allergies   Celebrex [celecoxib]; Doxycycline; Hctz [hydrochlorothiazide]; Levaquin [levofloxacin in d5w]; Sudafed [pseudoephedrine hcl]; Trazodone and nefazodone; Aleve [naproxen sodium]; Asa [aspirin]; Augmentin [amoxicillin-pot clavulanate]; Ceftin [cefuroxime axetil]; Ibuprofen; Keflex [cephalexin]; Nitrofurantoin; Nsaids; Penicillins; Sulfa antibiotics; and Zithromax [azithromycin]   Review of Systems Review of Systems  Unable to perform ROS: Patient nonverbal     Physical Exam Updated Vital Signs BP (!) 138/122   Pulse 87   Resp 19   Ht 5\' 5"  (1.651 m)   Wt 49.6 kg   SpO2 99%   BMI 18.20 kg/m   Physical Exam  Constitutional: She appears well-developed.  HENT:  Head: Normocephalic and atraumatic.  Mouth/Throat: Oropharynx is clear and moist.  Eyes: Conjunctivae are normal.  Neck: Normal range of motion. Neck supple.  Cardiovascular: Normal rate, regular rhythm, normal heart sounds and intact distal pulses.  No murmur heard. Pulmonary/Chest: Effort normal and breath sounds normal. No respiratory distress.  Abdominal: Soft. She exhibits no distension. There is no tenderness. There is no guarding.    Musculoskeletal:  No obvious deformities.   Neurological: She exhibits abnormal muscle tone.  Patient is alert, eyes are open, looking around.  She attempts to vocalize however is unable to form words.  Right sided facial droop.  She has right sided neglect, is not moving right arm or leg.    Skin: Skin is warm and dry. She is not diaphoretic.  Psychiatric:  Agitated   Nursing note and vitals reviewed.    ED Treatments / Results  Labs (all labs ordered are listed, but only abnormal results are displayed) Labs Reviewed  COMPREHENSIVE METABOLIC PANEL - Abnormal; Notable for the following components:      Result Value   Sodium 126 (*)  Chloride 92 (*)    Glucose, Bld 129 (*)    Total Bilirubin 1.3 (*)    All other components within normal limits  I-STAT CHEM 8, ED - Abnormal; Notable for the following components:   Sodium 123 (*)    Potassium 5.2 (*)    Chloride 93 (*)    Glucose, Bld 128 (*)    Calcium, Ion 0.91 (*)    Hemoglobin 15.6 (*)    All other components within normal limits  CBC  DIFFERENTIAL  I-STAT TROPONIN, ED  CBG MONITORING, ED    EKG EKG Interpretation  Date/Time:  Tuesday January 30 2018 19:07:32 EST Ventricular Rate:  87 PR Interval:    QRS Duration: 109 QT Interval:  434 QTC Calculation: 523 R Axis:   -47 Text Interpretation:  Sinus rhythm Consider left atrial enlargement LAD, consider left anterior fascicular block Anteroseptal infarct, age indeterminate Prolonged QT interval Baseline wander in lead(s) I II aVR V1 V5 V6 No significant change since last tracing Confirmed by Orlie Dakin [phone]) on 02/07/2018 7:21:02 PM   Radiology Ct Head Code Stroke Wo Contrast  Result Date: 01/20/2018 CLINICAL DATA:  Code stroke.  Slurred speech and left facial droop. EXAM: CT HEAD WITHOUT CONTRAST TECHNIQUE: Contiguous axial images were obtained from the base of the skull through the vertex without intravenous contrast. COMPARISON:  02/12/2017 FINDINGS:  Brain: Primarily high-density hematoma in the posterior left cerebrum, 6.4 x 3.4 x 6.1 cm-65 cc volume. The upper portion of the hematoma is less dense than the remainder, but this is most likely due to differential clot density rather than an underlying mass. There is associated edema. No intraventricular or subarachnoid extension is seen. There is local mass effect leading to 2 mm of midline shift. Vascular: No hyperdense vessel or unexpected calcification. Skull: No traumatic finding Sinuses/Orbits: Bilateral cataract resection Other: Critical Value/emergent results were called by telephone at the time of interpretation on 02/06/2018 at 5:50 pm to Dr. Leonie Man , who is already aware ASPECTS [facility] Program Early CT Score) Not scored in this setting IMPRESSION: Acute left cerebral hematoma measuring 65 cc. Midline shift is 2 mm. Electronically Signed   By: Monte Fantasia M.D.   On: 02/11/2018 17:52    Procedures Procedures (including critical care time)  CRITICAL CARE Performed by: Wyn Quaker Total critical care time: 30 minutes Critical care time was exclusive of separately billable procedures and treating other patients. Critical care was necessary to treat or prevent imminent or life-threatening deterioration. Critical care was time spent personally by me on the following activities: development of treatment plan with patient and/or surrogate as well as nursing, discussions with consultants, evaluation of patient's response to treatment, examination of patient, obtaining history from patient or surrogate, ordering and performing treatments and interventions, ordering and review of laboratory studies, ordering and review of radiographic studies, pulse oximetry and re-evaluation of patient's condition.  Medications Ordered in ED Medications  clevidipine (CLEVIPREX) 0.5 MG/ML infusion (has no administration in time range)  clevidipine (CLEVIPREX) infusion 0.5 mg/mL (2 mg/hr Intravenous  New Bag/Given 02/02/2018 1743)  haloperidol lactate (HALDOL) injection 0.5 mg (0.5 mg Intravenous Given 02/12/2018 1754)     Initial Impression / Assessment and Plan / ED Course  I have reviewed the triage vital signs and the nursing notes.  Pertinent labs & imaging results that were available during my care of the patient were reviewed by me and considered in my medical decision making (see chart for details).  Clinical Course as  of Jan 31 1835  Tue Jan 30, 2018  1743 Bleed on CT scan  CT HEAD CODE STROKE WO CONTRAST [EH]  1744 Patient will be admitted by Dr. From Neurology   [EH]  1756 Patient re-assessed, she is agitated.  Dr. Leonie Man has ordered haldol, will admit.    [EH]  1809 BP down trending.  Clevidipine held.    [EH]  1835 Patient reassessed.  Care transferred to neurology.   [EH]    Clinical Course User Index [EH] Lorin Glass, PA-C   Patient presents today for evaluation as a code stroke.  Last seen normal was 1620 today.  Upon arrival she has right-sided neglect and is a phasic.  She is slightly agitated however appears to understand commands.  I evaluated patient upon arrival at the emergency room with Dr. Winfred Leeds, patient appeared to be protecting her airway and was taken to CT scan. Neurology Dr. Leonie Man evaluated patient.  Patient was found to have Virgil, not a TPA candidate.  Patient was re-evaluated multiple times while in the ED.  Her BP was titrated with Clevidipine. Patient did not require intubation while in the emergency room.      Patient was admitted by neurology.    Patient was seen as a shared visit with Dr. Winfred Leeds.   Final Clinical Impressions(s) / ED Diagnoses   Final diagnoses:  Nontraumatic hemorrhage of left cerebral hemisphere [facility])  Hypertension, unspecified type    ED Discharge Orders    None       Lorin Glass, PA-C 01/31/18 0040    Orlie Dakin, MD 01/31/18 [phone]

## 2018-01-31 ENCOUNTER — Inpatient Hospital Stay (HOSPITAL_COMMUNITY): Payer: Medicare Other

## 2018-01-31 LAB — MRSA PCR SCREENING: MRSA BY PCR: NEGATIVE

## 2018-01-31 LAB — SODIUM: SODIUM: 127 mmol/L — AB (ref 135–145)

## 2018-01-31 MED ORDER — IOPAMIDOL (ISOVUE-370) INJECTION 76%
INTRAVENOUS | Status: AC
  Filled 2018-01-31: qty 100

## 2018-01-31 MED ORDER — HALOPERIDOL 0.5 MG PO TABS: 0.5000 mg | ORAL_TABLET | ORAL | Status: DC | PRN

## 2018-01-31 MED ORDER — IOPAMIDOL (ISOVUE-370) INJECTION 76%
75.0000 mL | Freq: Once | INTRAVENOUS | Status: AC | PRN
  Administered 2018-01-31: 05:00:00 via INTRAVENOUS

## 2018-01-31 MED ORDER — LORAZEPAM 2 MG/ML PO CONC
1.0000 mg | ORAL | Status: DC | PRN
  Filled 2018-01-31: qty 0.5

## 2018-01-31 MED ORDER — LORAZEPAM 1 MG PO TABS: 1.0000 mg | ORAL_TABLET | ORAL | Status: DC | PRN

## 2018-01-31 MED ORDER — HALOPERIDOL LACTATE 5 MG/ML IJ SOLN: 0.5000 mg | INTRAMUSCULAR | Status: DC | PRN

## 2018-01-31 MED ORDER — ACETAMINOPHEN 650 MG RE SUPP: 650.0000 mg | Freq: Four times a day (QID) | RECTAL | Status: DC | PRN

## 2018-01-31 MED ORDER — MORPHINE SULFATE (CONCENTRATE) 10 MG/0.5ML PO SOLN: 5.0000 mg | ORAL | Status: DC | PRN

## 2018-01-31 MED ORDER — ACETAMINOPHEN 325 MG PO TABS: 650.0000 mg | ORAL_TABLET | Freq: Four times a day (QID) | ORAL | Status: DC | PRN

## 2018-01-31 MED ORDER — MORPHINE SULFATE (CONCENTRATE) 10 MG/0.5ML PO SOLN
5.0000 mg | ORAL | Status: DC | PRN
  Administered 2018-01-31 – 2018-02-01 (×5): 5 mg via SUBLINGUAL
  Filled 2018-01-31 (×7): qty 0.5

## 2018-01-31 MED ORDER — SODIUM CHLORIDE 3 % IV SOLN
INTRAVENOUS | Status: DC
  Administered 2018-01-31: 50 mL/h via INTRAVENOUS
  Filled 2018-01-31: qty 500

## 2018-01-31 MED ORDER — LORAZEPAM 2 MG/ML IJ SOLN: 1.0000 mg | INTRAMUSCULAR | Status: DC | PRN

## 2018-01-31 MED ORDER — HALOPERIDOL LACTATE 2 MG/ML PO CONC
0.5000 mg | ORAL | Status: DC | PRN
  Filled 2018-01-31: qty 0.3

## 2018-01-31 NOTE — Progress Notes (Addendum)
STROKE TEAM PROGRESS NOTE   INTERVAL HISTORY Her daughter and husband are at the bedside. She had neurological worsening last night and hypertonic saline was started. She is lying in bed, not intubated.family is leaning towards comfort care.  Vitals:   01/31/18 0815 01/31/18 0830 01/31/18 0845 01/31/18 0900  BP: (!) 153/120 (!) 107/47 (!) 133/111 (!) 93/37  Pulse: 82 83 82 80  Resp: (!) 21 12 19  (!) 22  Temp:      TempSrc:      SpO2: 95% 95% 93% 94%  Weight:      Height:        CBC:  Recent Labs  Lab 02/03/2018 1716 01/24/2018 1728  WBC 7.7  --   NEUTROABS 5.0  --   HGB 14.4 15.6*  HCT 41.2 46.0  MCV 93.4  --   PLT 269  --     Basic Metabolic Panel:  Recent Labs  Lab 01/19/2018 1716 02/05/2018 1728 01/31/18 0637  NA 126* 123* 127*  K 4.4 5.2*  --   CL 92* 93*  --   CO2 22  --   --   GLUCOSE 129* 128*  --   BUN 11 14  --   CREATININE 0.75 0.70  --   CALCIUM 8.9  --   --    Lipid Panel: No results found for: CHOL, TRIG, HDL, CHOLHDL, VLDL, LDLCALC HgbA1c: No results found for: HGBA1C Urine Drug Screen: No results found for: LABOPIA, COCAINSCRNUR, LABBENZ, AMPHETMU, THCU, LABBARB  Alcohol Level No results found for: ETH  IMAGING Ct Angio Head W Or Wo Contrast  Result Date: 01/31/2018 CLINICAL DATA:  Follow up intracranial hemorrhage. EXAM: CT ANGIOGRAPHY HEAD AND NECK TECHNIQUE: Multidetector CT imaging of the head and neck was performed using the standard protocol during bolus administration of intravenous contrast. Multiplanar CT image reconstructions and MIPs were obtained to evaluate the vascular anatomy. Carotid stenosis measurements (when applicable) are obtained utilizing NASCET criteria, using the distal internal carotid diameter as the denominator. CONTRAST:  75 cc ISOVUE-370 IOPAMIDOL (ISOVUE-370) INJECTION 76% COMPARISON:  CT HEAD January 30, 2018 FINDINGS: CTA NECK FINDINGS: AORTIC ARCH: Normal appearance of the thoracic arch, normal branch pattern. Mild  calcific atherosclerosis aortic arch. Origin of innominate and LEFT common carotid artery not included. Calcific atherosclerosis resulting in mild stenosis LEFT subclavian artery. RIGHT CAROTID SYSTEM: Common carotid artery is patent. Mild calcific atherosclerosis of the carotid bifurcation without hemodynamically significant stenosis by NASCET criteria. Less than 1 cm segment of severe stenosis mid RIGHT cervical carotid artery with poor contrast opacification, poststenotic dilatation with moderate luminal irregularity. However, streak artifact from dental amalgam limits assessment. LEFT CAROTID SYSTEM: Common carotid artery is patent, mild calcific atherosclerosis. Mild calcific atherosclerosis of the carotid bifurcation without hemodynamically significant stenosis by NASCET criteria. Severe luminal irregularity without flow-limiting stenosis. VERTEBRAL ARTERIES:Left vertebral artery is dominant. Moderate stenosis LEFT vertebral artery origin. Moderate luminal irregularity and segmental caliber changes RIGHT vertebral artery. SKELETON: No acute osseous process though bone windows have not been submitted. Severe C5-6 spondylosis and advanced LEFT facet arthropathy. OTHER NECK: Soft tissues of the neck are nonacute though, not tailored for evaluation. UPPER CHEST: Included lung apices are clear. No superior mediastinal lymphadenopathy. CTA HEAD FINDINGS: ANTERIOR CIRCULATION: Patent cervical internal carotid arteries, petrous, cavernous and supra clinoid internal carotid arteries. Patent anterior communicating artery. Attenuated LEFT M2 segments and paucity of distal LEFT MCA branches. Patent RIGHT MCA and bilateral ACA. No  contrast extravasation or aneurysm. POSTERIOR CIRCULATION: Patent  vertebral arteries, vertebrobasilar junction and basilar artery, as well as main branch vessels. Patent posterior cerebral arteries, moderate luminal irregularity compatible with atherosclerosis. No large vessel occlusion,  significant stenosis, contrast extravasation or aneurysm. VENOUS SINUSES: Major dural venous sinuses are patent though not tailored for evaluation on this angiographic examination. ANATOMIC VARIANTS: None. DELAYED PHASE: Not performed. MIP images reviewed. IMPRESSION: CTA NECK: 1. Focal severe stenosis RIGHT internal carotid artery (possibly overestimated due to streak artifact), possible old dissection without pseudoaneurysm. 2. LEFT greater than RIGHT internal carotid artery fibromuscular dysplasia. 3. 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 CTA HEAD: 1. LEFT M2 occlusion with generally decreased LEFT MCA vessels, possible vasospasm or extrinsic compression from known hematoma. 2. Moderate intracranial atherosclerosis. These results will be called to the ordering clinician or representative by the professional radiologist assistant, and communication documented in zVision Dashboard. Aortic Atherosclerosis (ICD10-I70.0). Electronically Signed   By: Elon Alas M.D.   On: 01/31/2018 06:01   Ct Angio Neck W And/or Wo Contrast  Result Date: 01/31/2018 CLINICAL DATA:  Follow up intracranial hemorrhage. EXAM: CT ANGIOGRAPHY HEAD AND NECK TECHNIQUE: Multidetector CT imaging of the head and neck was performed using the standard protocol during bolus administration of intravenous contrast. Multiplanar CT image reconstructions and MIPs were obtained to evaluate the vascular anatomy. Carotid stenosis measurements (when applicable) are obtained utilizing NASCET criteria, using the distal internal carotid diameter as the denominator. CONTRAST:  75 cc ISOVUE-370 IOPAMIDOL (ISOVUE-370) INJECTION 76% COMPARISON:  CT HEAD January 30, 2018 FINDINGS: CTA NECK FINDINGS: AORTIC ARCH: Normal appearance of the thoracic arch, normal branch pattern. Mild calcific atherosclerosis aortic arch. Origin of innominate and LEFT common carotid  artery not included. Calcific atherosclerosis resulting in mild stenosis LEFT subclavian artery. RIGHT CAROTID SYSTEM: Common carotid artery is patent. Mild calcific atherosclerosis of the carotid bifurcation without hemodynamically significant stenosis by NASCET criteria. Less than 1 cm segment of severe stenosis mid RIGHT cervical carotid artery with poor contrast opacification, poststenotic dilatation with moderate luminal irregularity. However, streak artifact from dental amalgam limits assessment. LEFT CAROTID SYSTEM: Common carotid artery is patent, mild calcific atherosclerosis. Mild calcific atherosclerosis of the carotid bifurcation without hemodynamically significant stenosis by NASCET criteria. Severe luminal irregularity without flow-limiting stenosis. VERTEBRAL ARTERIES:Left vertebral artery is dominant. Moderate stenosis LEFT vertebral artery origin. Moderate luminal irregularity and segmental caliber changes RIGHT vertebral artery. SKELETON: No acute osseous process though bone windows have not been submitted. Severe C5-6 spondylosis and advanced LEFT facet arthropathy. OTHER NECK: Soft tissues of the neck are nonacute though, not tailored for evaluation. UPPER CHEST: Included lung apices are clear. No superior mediastinal lymphadenopathy. CTA HEAD FINDINGS: ANTERIOR CIRCULATION: Patent cervical internal carotid arteries, petrous, cavernous and supra clinoid internal carotid arteries. Patent anterior communicating artery. Attenuated LEFT M2 segments and paucity of distal LEFT MCA branches. Patent RIGHT MCA and bilateral ACA. No  contrast extravasation or aneurysm. POSTERIOR CIRCULATION: Patent vertebral arteries, vertebrobasilar junction and basilar artery, as well as main branch vessels. Patent posterior cerebral arteries, moderate luminal irregularity compatible with atherosclerosis. No large vessel occlusion, significant stenosis, contrast extravasation or aneurysm. VENOUS SINUSES: Major dural  venous sinuses are patent though not tailored for evaluation on this angiographic examination. ANATOMIC VARIANTS: None. DELAYED PHASE: Not performed. MIP images reviewed. IMPRESSION: CTA NECK: 1. Focal severe stenosis RIGHT internal carotid artery (possibly overestimated due to streak artifact), possible old dissection without pseudoaneurysm. 2. LEFT greater than RIGHT internal carotid artery fibromuscular dysplasia. 3.  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 CTA HEAD: 1. LEFT M2 occlusion with generally decreased LEFT MCA vessels, possible vasospasm or extrinsic compression from known hematoma. 2. Moderate intracranial atherosclerosis. These results will be called to the ordering clinician or representative by the professional radiologist assistant, and communication documented in zVision Dashboard. Aortic Atherosclerosis (ICD10-I70.0). Electronically Signed   By: Elon Alas M.D.   On: 01/31/2018 06:01   Ct Head Code Stroke Wo Contrast  Result Date: 02/16/2018 CLINICAL DATA:  Code stroke.  Slurred speech and left facial droop. EXAM: CT HEAD WITHOUT CONTRAST TECHNIQUE: Contiguous axial images were obtained from the base of the skull through the vertex without intravenous contrast. COMPARISON:  02/12/2017 FINDINGS: Brain: Primarily high-density hematoma in the posterior left cerebrum, 6.4 x 3.4 x 6.1 cm-65 cc volume. The upper portion of the hematoma is less dense than the remainder, but this is most likely due to differential clot density rather than an underlying mass. There is associated edema. No intraventricular or subarachnoid extension is seen. There is local mass effect leading to 2 mm of midline shift. Vascular: No hyperdense vessel or unexpected calcification. Skull: No traumatic finding Sinuses/Orbits: Bilateral cataract resection Other: Critical Value/emergent results were called by telephone at the  time of interpretation on 01/25/2018 at 5:50 pm to Dr. Leonie Man , who is already aware ASPECTS [facility] Stroke Program Early CT Score) Not scored in this setting IMPRESSION: Acute left cerebral hematoma measuring 65 cc. Midline shift is 2 mm. Electronically Signed   By: Monte Fantasia M.D.   On: 01/31/2018 17:52    PHYSICAL EXAM Elderly Caucasian lady lying comfortably in bed. Not in distress. . Afebrile. Head is nontraumatic. Neck is supple without bruit.    Cardiac exam no murmur or gallop. Lungs are clear to auscultation. Distal pulses are well felt. Neurological Exam ; stuporose.globally aphasic. Makes a few guttural sounds and nonsensical words. Not following even simple commands. She has left gaze deviation unable to look to the right even up to midline. She blinks to threat on the left but not on the right. Right lower facial weakness. Tongue midline. Right hemiplegia 2/5 strength in the right upper and lower extremity. Purposeful antigravity strength on the left side. Deep tendon reflexes are depressed on the right and normal on the left. Right plantar upgoing left downgoing. She withdraws to painful stimuli on the right. Gait not tested  ASSESSMENT/PLAN Ms. MARLIES LIGMAN is a 84 y.o. female with history of parathyroidism, hypertension, hyperlipidemia presenting with aphasia and R HP. CT shows large L cerebral hemorrhage with increasing mass effect over night .   Stroke:  Large L cerebral hemorrhage cytotoxic cerebral edema, hemorrhage secondary to left M 2 occlusion and  ? Hemorrhagic infarct   Code Stroke CT head acute left cerebral hemorrhage 65 mL.  Midline shift 2 mm  CTA head left M2 occlusion with decreased L MCA vessels.  Possible vasospasm from known hematoma.  Intracranial atherosclerosis.  CTA neck focal severe stenosis R ICA, possible old dissection without pseudoaneurysm.  Left greater than right ICA FMD.  Luminal irregularity RVA with old non-flow-limiting dissection, FMD or  atherosclerosis  SCDs for VTE prophylaxis  on 3% for induced hypernatremia to prevent cerebral edema  NPO  aspirin 81 mg daily prior to admission, now on No antithrombotic given hemorrhage  Patient stroke is large.  Given her age and size, will be debilitating lifelong.  Family has opted for comfort care.  She is already DNR.  Will transition to comfort care.  Orders for test discontinued and occasions for comfort ordered.  Transfer to floor, palliative care   Social worker consulted for disposition to residential hospice should she survive the next 24 hours   Hypertension  Currently on Cleviprex for systolic blood pressure goal less than 160.  Will discontinue   Hyperlipidemia  Home meds:  zocor 20  Other Stroke Risk Factors  Advanced age  Other Active Problems  Hyponatremia on arrival, [facility] day # [place], MSN, APRN, ANVP-BC, AGPCNP-BC Advanced Practice Stroke Nurse [place] for Schedule & Pager information 01/31/2018 2:24 PM  I have personally examined this patient, reviewed notes, independently viewed imaging studies, participated in medical decision making and plan of care.ROS completed by me personally and pertinent positives fully documented  I have made any additions or clarifications directly to the above note. Agree with note above.I had a long discussion with the patient's daughter, . granddaughter and husband about her poor prognosis and chances of survival and making meaningful recovery not been great. Family appears quite clear that patient would not want to live a life of disability or have feeding tube and nursing home care which appears unavoidable. They've decided to make the patient comfort care only. He'll transfer the patient to hospice unit and to hospice nursing home later if necessary This patient is critically ill and at significant risk of neurological worsening, death and care requires constant monitoring of  vital signs, hemodynamics,respiratory and cardiac monitoring, extensive review of multiple databases, frequent neurological assessment, discussion with family, other specialists and medical decision making of high complexity.I have made any additions or clarifications directly to the above note.This critical care time does not reflect procedure time, or teaching time or supervisory time of PA/NP/Med Resident etc but could involve care discussion time.  I spent 35 minutes of neurocritical care time  in the care of  this patient.     Antony Contras, MD Medical Director [facility] Pager: [phone] 01/31/2018 5:56 PM  To contact Stroke Continuity provider, please refer to [email or web address] After hours, contact General Neurology

## 2018-01-31 NOTE — Progress Notes (Signed)
PT Cancellation Note  Patient Details Name: MILEE QUALLS MRN: [id number] DOB: 1930-01-30   Cancelled Treatment:    Reason Eval/Treat Not Completed: Other (comment);Medical issues which prohibited therapy.  it appears pt is transitioning to comfor care.  Will sign off at this time. 01/31/2018  Donnella Sham, [place] [phone]  (pager) [phone]  (office)   Tessie Fass [place] Clos 01/31/2018, 5:58 PM

## 2018-01-31 NOTE — Plan of Care (Signed)
  Problem: Education: Goal: Knowledge of disease or condition will improve Outcome: Not Progressing Goal: Knowledge of secondary prevention will improve Outcome: Not Progressing Goal: Knowledge of patient specific risk factors addressed and post discharge goals established will improve Outcome: Not Progressing Goal: Individualized Educational Video(s) Outcome: Not Progressing   Problem: Coping: Goal: Will verbalize positive feelings about self Outcome: Not Progressing Goal: Will identify appropriate support needs Outcome: Not Progressing   Problem: Health Behavior/Discharge Planning: Goal: Ability to manage health-related needs will improve Outcome: Not Progressing   Problem: Self-Care: Goal: Ability to participate in self-care as condition permits will improve Outcome: Not Progressing Goal: Verbalization of feelings and concerns over difficulty with self-care will improve Outcome: Not Progressing Goal: Ability to communicate needs accurately will improve Outcome: Not Progressing   Problem: Nutrition: Goal: Risk of aspiration will decrease Outcome: Not Progressing Goal: Dietary intake will improve Outcome: Not Progressing   Problem: Intracerebral Hemorrhage Tissue Perfusion: Goal: Complications of Intracerebral Hemorrhage will be minimized Outcome: Not Progressing   Problem: Education: Goal: Knowledge of General Education information will improve Description Including pain rating scale, medication(s)/side effects and non-pharmacologic comfort measures Outcome: Not Progressing   Problem: Health Behavior/Discharge Planning: Goal: Ability to manage health-related needs will improve Outcome: Not Progressing   Problem: Clinical Measurements: Goal: Ability to maintain clinical measurements within normal limits will improve Outcome: Not Progressing Goal: Will remain free from infection Outcome: Not Progressing Goal: Diagnostic test results will improve Outcome: Not  Progressing Goal: Respiratory complications will improve Outcome: Not Progressing Goal: Cardiovascular complication will be avoided Outcome: Not Progressing   Problem: Activity: Goal: Risk for activity intolerance will decrease Outcome: Not Progressing   Problem: Nutrition: Goal: Adequate nutrition will be maintained Outcome: Not Progressing   Problem: Coping: Goal: Level of anxiety will decrease Outcome: Not Progressing   Problem: Elimination: Goal: Will not experience complications related to bowel motility Outcome: Not Progressing Goal: Will not experience complications related to urinary retention Outcome: Not Progressing   Problem: Pain Managment: Goal: General experience of comfort will improve Outcome: Not Progressing   Problem: Safety: Goal: Ability to remain free from injury will improve Outcome: Not Progressing   Problem: Skin Integrity: Goal: Risk for impaired skin integrity will decrease Outcome: Not Progressing

## 2018-01-31 NOTE — Progress Notes (Signed)
Nutrition Brief Note  Chart reviewed. Pt now transitioning to comfort care.  No further nutrition interventions warranted at this time.  Please re-consult as needed.   Harleigh Civello A. Cordney Barstow, RD, LDN, CDE Pager: [phone] After hours Pager: [phone]  

## 2018-01-31 NOTE — Progress Notes (Signed)
Occupational Therapy Discharge Patient Details Name: Shelley Cook MRN: [id number] DOB: 08-Aug-1929 Today's Date: 01/31/2018 Time:  -     Patient discharged from OT services secondary to medical decline - will need to re-order OT to resume therapy services.  Please see latest therapy progress note for current level of functioning and progress toward goals.    Progress and discharge plan discussed with patient and/or caregiver: Patient unable to participate in discharge planning and family has made the decision for comfort care only.  [name], OTR/L  Acute Rehabilitation Services Pager: [phone] Office: [phone] .  01/31/2018, 11:40 AM

## 2018-01-31 NOTE — Social Work (Signed)
CSW acknowledging consult for comfort care. Will follow for disposition should hospice services or residential hospice become appropriate.   Kallum Jorgensen H Darnisha Vernet, LCSWA [facility] Clinical Social Work [phone]   

## 2018-01-31 NOTE — Psychosocial Assessment (Signed)
Received patient from [facility]N, report given by nurse amy. Patient somnolent unable to respond to verbal stimuli.

## 2018-01-31 NOTE — Progress Notes (Signed)
0.25 mg of morphine wasted in sharps with Willia Craze RN

## 2018-01-31 NOTE — Progress Notes (Addendum)
Repeat scan with increasing mass effect, likely M2 occlusion. I started 3% NS at 50cc/hr given hyponatremia and cerebral edema.   I discussed repeat scan with family given that the patient has been sleepier, possibility that she may need intubation if she continues to worsen. After discussing likely outcome of severe disability if she survives, the family has agreed to no heroic measures and to that end will make her DNR, and no hemicraniectomy.   Roland Rack, MD [facility] [phone]  If 7pm- 7am, please page neurology on call as listed in [place].

## 2018-02-01 DIAGNOSIS — I610 Nontraumatic intracerebral hemorrhage in hemisphere, subcortical: Secondary | ICD-10-CM

## 2018-02-01 MED ORDER — MORPHINE SULFATE (PF) 2 MG/ML IV SOLN
1.0000 mg | INTRAVENOUS | Status: DC | PRN
  Administered 2018-02-02 – 2018-02-04 (×3): 2 mg via INTRAVENOUS
  Filled 2018-02-01 (×3): qty 1

## 2018-02-01 MED ORDER — MORPHINE BOLUS VIA INFUSION: 1.0000 mg | INTRAVENOUS | Status: DC | PRN

## 2018-02-01 MED ORDER — ATROPINE SULFATE 1 % OP SOLN: 2.0000 [drp] | Freq: Four times a day (QID) | OPHTHALMIC | Status: DC | PRN

## 2018-02-01 MED ORDER — MORPHINE SULFATE (CONCENTRATE) 10 MG/0.5ML PO SOLN
10.0000 mg | ORAL | Status: DC | PRN
  Administered 2018-02-01 – 2018-02-02 (×3): 10 mg via SUBLINGUAL
  Filled 2018-02-01 (×2): qty 0.5

## 2018-02-01 MED ORDER — ACETAMINOPHEN 650 MG RE SUPP
650.0000 mg | Freq: Four times a day (QID) | RECTAL | Status: DC | PRN
  Administered 2018-02-01: 650 mg via RECTAL
  Filled 2018-02-01: qty 1

## 2018-02-01 MED ORDER — MORPHINE SULFATE (CONCENTRATE) 10 MG/0.5ML PO SOLN: 5.0000 mg | ORAL | Status: DC | PRN

## 2018-02-01 MED ORDER — ORAL CARE MOUTH RINSE
15.0000 mL | Freq: Two times a day (BID) | OROMUCOSAL | Status: DC
  Administered 2018-02-01 – 2018-02-04 (×6): 15 mL via OROMUCOSAL

## 2018-02-01 NOTE — Progress Notes (Signed)
Pt comfortably in bed with family at bedside.  They have comfort cart refreshments at bedside.  Will continue to assess level of comfort.

## 2018-02-01 NOTE — Consult Note (Signed)
            [facility] CM Primary Care Navigator  02/01/2018  KHALIYA GOLINSKI 11-May-1929 [id number]    Went to seepatientat the bedsideto identify possible discharge needsbutpatient is non- verbal/ aphasic, not interactive.     Per chart review, patient presented with aphasia and right hemiplegia. CT shows large left cerebral hemorrhage with increasing mass effect over night.  Per MD note, patient's stroke is large. Given her age and size, she will be debilitating lifelong.  Family has opted for comfort care.  Patient is DNR. She was transitioned to comfort care.   Per inpatient social worker note,will follow disposition for possible hospice services or residential hospice .  Noted no further [facility] care management needs identifiable at this point.    For additional questions please contact:  Edwena Felty A. Reshma Hoey, BSN, RN-BC [facility] PRIMARY CARE Navigator Cell: [phone]

## 2018-02-01 NOTE — Progress Notes (Addendum)
STROKE TEAM PROGRESS NOTE   INTERVAL HISTORY Her family, including the husband are at the bedside. She is lying in bed, with some respiratory distress. Last dose or morphine 845 am. Discussed residential palliative care - husband would like her to stay here if she can. VSS.  Vitals:   01/31/18 1100 01/31/18 1200 01/31/18 1227 02/01/18 0500  BP: 116/89 (!) 149/63 (!) 174/63 (!) 161/64  Pulse: 86 80 81 80  Resp: 17 16 16 15   Temp:   97.7 F (36.5 C) 97.6 F (36.4 C)  TempSrc:   Oral Axillary  SpO2: 96% 95% 95% 94%  Weight:      Height:        PHYSICAL EXAM per Leonie Man Elderly Caucasian lady lying in bed. in mild respiratory distress. Afebrile. Head is nontraumatic. Neck is supple without bruit. Cardiac exam no murmur or gallop. Lungs are clear to auscultation. Distal pulses are well felt. Neurological Exam stuporose. globally aphasic. Not interactive, not following simple commands. She has left gaze deviation unable to look to the right even up to midline. She blinks to threat on the left but not on the right. Right lower facial weakness. Tongue midline. Right hemiplegia 2/5 strength in the right upper and lower extremity. Purposeful antigravity strength on the left side. Deep tendon reflexes are depressed on the right and normal on the left. Right plantar upgoing left downgoing. She withdraws to painful stimuli on the right. Gait not tested   ASSESSMENT/PLAN [name] is a 84 y.o. female with history of parathyroidism, hypertension, hyperlipidemia presenting with aphasia and R HP. CT shows large L cerebral hemorrhage with increasing mass effect over night .   Stroke:  Large L cerebral hemorrhage cytotoxic cerebral edema, hemorrhage secondary to left M 2 occlusion and  ? Hemorrhagic infarct   Code Stroke CT head acute left cerebral hemorrhage 65 mL.  Midline shift 2 mm  CTA head left M2 occlusion with decreased L MCA vessels.  Possible vasospasm from known hematoma.  Intracranial  atherosclerosis.  CTA neck focal severe stenosis R ICA, possible old dissection without pseudoaneurysm.  Left greater than right ICA FMD.  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  Given her age and size, will be debilitating lifelong.  Family has opted for comfort care.  She is DNR.    Continue Comfort care.    Increase SL morphine. Add morphine IV prn. Add atropine for secretions.  Social worker following  Hypertension Treated with Cleviprex in ICUfor systolic blood pressure goal less than 160.    Hyperlipidemia Home meds:  zocor 20  Other Stroke Risk Factors Advanced age  Other Active Problems Hyponatremia, [facility] day # 2  Burnetta Sabin, MSN, APRN, ANVP-BC, AGPCNP-BC Advanced Practice Stroke Nurse [place] for Schedule & Pager information 02/01/2018 1:59 PM  I have personally examined this patient, reviewed notes, independently viewed imaging studies, participated in medical decision making and plan of care.ROS completed by me personally and pertinent positives fully documented  I have made any additions or clarifications directly to the above note. Agree with note above.   Antony Contras, MD Medical Director [facility] Stroke Center Pager: [phone] 02/01/2018 6:37 PM  To contact Stroke Continuity provider, please  refer to [email or web address] After hours, contact General Neurology

## 2018-02-01 NOTE — Progress Notes (Signed)
Pt daughter requesting to talk to someone about the stages of transitioning to death.  Spoke with daughter.  She was also requesting to speak with someone from Palliative care.

## 2018-02-01 NOTE — Progress Notes (Signed)
Family given booklet explaining the transitions of death.

## 2018-02-01 NOTE — Plan of Care (Signed)
  Problem: Coping: Goal: Level of anxiety will decrease Outcome: Progressing   Problem: Elimination: Goal: Will not experience complications related to urinary retention Outcome: Progressing   Problem: Pain Managment: Goal: General experience of comfort will improve Outcome: Progressing   Problem: Role Relationship: Goal: Family's ability to cope with current situation will improve Outcome: Progressing Goal: Ability to verbalize concerns, feelings, and thoughts to partner or family member will improve Outcome: Progressing   Problem: Pain Management: Goal: Satisfaction with pain management regimen will improve Outcome: [place], RN 02/01/2018

## 2018-02-02 MED ORDER — GLYCOPYRROLATE 0.2 MG/ML IJ SOLN: 0.2000 mg | INTRAMUSCULAR | Status: DC | PRN

## 2018-02-02 NOTE — Progress Notes (Signed)
Chaplain Consult Note:   I received referral from [facility] with Palliative Care for support of family. As indicated in her note this is a spiritual family who are grieving the loss of a woman whose family hold in great affection. She and her husband have been married just short of 67 years - anniversary would have been Dec. 16th. Two of their children, son and daughter, daughter in-law and granddaughter are in the room. They are emotionally grappling with the suddenness of this stroke that is now leading to the end of her life. They recalled a recent family luncheon celebrating the granddaughter's 15th birthday and how healthy she seemed to be and the good time they had together. Her husband who has had several heart events said he had assumed he would " go before her."   They seem to have good faith community support and certainly are supportive and loving toward each other. They are experiencing and moving through normal grief. They expressed gratitude for the help of the Palliative Care Nurse and team and that they feel the nurses of the unit are providing very good care to Mrs. Ascher.   They asked if I would offer a prayer for her which I did, while the family gathered around her bedside. I informed them that there is a chaplain in the hospital at night and if they would like that person to be with them to ask the nurse to page her.   Thank you for the request to be of support to this family.  Jeanella Craze, M.Div., [place] [phone]

## 2018-02-02 NOTE — Progress Notes (Signed)
Gave pt 2mg . IV morphine to assist with labored breathing noted.  Family at bedside but plan on going home for the night later.  Very appreciative of care and the Palliative Care support as well as Chaplaincy.

## 2018-02-02 NOTE — Progress Notes (Addendum)
Palliative Medicine RN Note: Consult seen by PMT RN. Discussed patient with Dr Hilma Favors, who adjusted orders.   Patient had large, debilitating CVA and was made comfort care by neurology. Family is requesting information and support for dying process.   We discussed at length the dying process and s/s imminent demise. We discussed use of lorazepam, morphine, Robinul for symptom management. Family prefers to avoid use of Tylenol suppositories, instead wanting cool washcloths, air conditioning, fan and ice packs.   She is beginning to have terminal secretions, so PO meds were changed to IV. Spiritual care has been consulted, and Jeanella Craze is already at the bedside.   At this time, patient is actively dying. She is having dyspnea & terminal secretions. Her face is sunken; lips are blue; feet are cold. Due to dyspnea, she would require substantial sedation to tolerate an ambulance transport. PMT does not support a move to a hospice facility unless family is willing to take the risk of death in the ambulance during transport, and they do not.  Plan for PMT follow up for symptoms tomorrow.  Shelley Cook Shelley Jafri, RN, BSN, [facility] Palliative Medicine Team 02/02/2018 4:34 PM Office [phone]

## 2018-02-02 NOTE — Progress Notes (Signed)
STROKE TEAM PROGRESS NOTE   INTERVAL HISTORY Her  husband,is  at the bedside. She is lying in bed, with some respiratory distress.  Discussed residential palliative care - husband would like her to stay here if she can .  Vitals:   01/31/18 1227 02/01/18 0500 02/01/18 2016 02/02/18 0100  BP: (!) 174/63 (!) 161/64 (!) 158/60   Pulse: 81 80 82   Resp: 16 15 15    Temp: 97.7 F (36.5 C) 97.6 F (36.4 C) (!) 101.5 F (38.6 C) 98 F (36.7 C)  TempSrc: Oral Axillary Axillary   SpO2: 95% 94% 92%   Weight:      Height:        PHYSICAL EXAM per Leonie Man Elderly Caucasian lady lying in bed. in mild respiratory distress. Afebrile. Head is nontraumatic. Neck is supple without bruit. Cardiac exam no murmur or gallop. Lungs are clear to auscultation. Distal pulses are well felt. Neurological Exam stuporose. globally aphasic. Not interactive, not following simple commands. She has left gaze deviation unable to look to the right even up to midline. She blinks to threat on the left but not on the right. Right lower facial weakness. Tongue midline. Right hemiplegia 2/5 strength in the right upper and lower extremity. Purposeful antigravity strength on the left side. Deep tendon reflexes are depressed on the right and normal on the left. Right plantar upgoing left downgoing. She withdraws to painful stimuli on the right. Gait not tested   ASSESSMENT/PLAN Ms. DANE BLOCH is a 84 y.o. female with history of parathyroidism, hypertension, hyperlipidemia presenting with aphasia and R HP. CT shows large L cerebral hemorrhage with increasing mass effect over night .   Stroke:  Large L cerebral hemorrhage cytotoxic cerebral edema, hemorrhage secondary to left M 2 occlusion and  ? Hemorrhagic infarct   Code Stroke CT head acute left cerebral hemorrhage 65 mL.  Midline shift 2 mm  CTA head left M2 occlusion with decreased L MCA vessels.  Possible vasospasm from known hematoma.  Intracranial  atherosclerosis.  CTA neck focal severe stenosis R ICA, possible old dissection without pseudoaneurysm.  Left greater than right ICA FMD.  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  Given her age and size, will be debilitating lifelong.  Family has opted for comfort care.  She is DNR.    Continue Comfort care.    Increase SL morphine. Add morphine IV prn. Add atropine for secretions.  Social worker following  Hypertension Treated with Cleviprex in ICUfor systolic blood pressure goal less than 160.    Hyperlipidemia Home meds:  zocor 20  Other Stroke Risk Factors Advanced age  Other Active Problems Hyponatremia, [facility] day # 3  Continue palliative care.no treatment changes at this time. Antony Contras, MD  To contact Stroke Continuity provider, please refer to [email or web address] After hours, contact General Neurology

## 2018-02-03 DIAGNOSIS — Z515 Encounter for palliative care: Secondary | ICD-10-CM

## 2018-02-03 DIAGNOSIS — I10 Essential (primary) hypertension: Secondary | ICD-10-CM

## 2018-02-03 DIAGNOSIS — I612 Nontraumatic intracerebral hemorrhage in hemisphere, unspecified: Secondary | ICD-10-CM

## 2018-02-03 DIAGNOSIS — R4189 Other symptoms and signs involving cognitive functions and awareness: Secondary | ICD-10-CM

## 2018-02-03 NOTE — Progress Notes (Signed)
Daily Progress Note   Patient Name: Shelley Cook       Date: 02/03/2018 DOB: [birth year]  Age: 84 y.o. MRN#: [id number] Attending Physician: Rosalin Hawking, MD Primary Care Physician: [name], MD Admit Date: 02/17/2018  Reason for Consultation/Follow-up: Terminal Care  Subjective: Patient unresponsive to sternal rub. Appears comfortable with no s/s of pain or distress. Regular, shallow respiratory pattern.   GOC:  Family at bedside, including patient's daughter, son, and daughter-in-law. Discussed plan of care for comfort and reviewed medications on board to ensure comfort and dignity at EOL. Educated on EOL expectations and prognosis. They were given Hard Choices and Gone from my Sight booklets yesterday by palliative RN. We discussed that patient has not required frequent prn doses for comfort but that meds are available if symptoms change. Family confirms understanding.   Therapeutic listening as family shares stories and pictures of Shelley Cook. They are still in shock with how quickly life has changed, when last Saturday she was out with family at [employer] for granddaughter's birthday. Emotional support provided. Spiritual family and very appreciative of visit from chaplain yesterday. Daughter and son's pastors are also available for support. Spiritual support provided. Answered all questions regarding EOL care.   Length of Stay: 4  Current Medications: Scheduled Meds:  . mouth rinse  15 mL Mouth Rinse BID    Continuous Infusions:  PRN Meds: acetaminophen, glycopyrrolate, [DISCONTINUED] haloperidol **OR** [DISCONTINUED] haloperidol **OR** haloperidol lactate, [DISCONTINUED] LORazepam **OR** [DISCONTINUED] LORazepam **OR** LORazepam, morphine  Physical Exam    Constitutional: She appears ill.  HENT:  Head: Normocephalic and atraumatic.  Cardiovascular: Regular rhythm.  Pulmonary/Chest: Breath sounds normal. No accessory muscle usage. No tachypnea. No respiratory distress.  Abdominal: There is no tenderness.  Neurological: She is unresponsive.  Skin: Skin is warm and dry. There is pallor.  Nursing note and vitals reviewed.          Vital Signs: BP (!) 142/59 (BP Location: Right Arm)   Pulse 94   Temp 99.4 F (37.4 C) (Oral)   Resp 18   Ht 5\' 5"  (1.651 m)   Wt 47.6 kg   SpO2 (!) 87%   BMI 17.46 kg/m  SpO2: SpO2: (!) 87 % O2 Device: O2 Device: Room Air O2 Flow Rate:    Intake/output summary:   Intake/Output Summary (  Last 24 hours) at 02/03/2018 1047 Last data filed at 02/03/2018 0250 Gross per 24 hour  Intake -  Output 0 ml  Net 0 ml   LBM: Last BM Date: (pta) Baseline Weight: Weight: 49.6 kg Most recent weight: Weight: 47.6 kg       Palliative Assessment/Data: PPS 10%   Flowsheet Rows     Most Recent Value  Intake Tab  Referral Department  Neurology  Unit at Time of Referral  Med/Surg Unit  Palliative Care Primary Diagnosis  Neurology  Date Notified  02/02/18  Palliative Care Type  New Palliative care  Reason for referral  End of Life Care Assistance  Date of Admission  02/15/2018  Date first seen by Palliative Care  02/02/18  # of days Palliative referral response time  0 Day(s)  # of days IP prior to Palliative referral  3  Clinical Assessment  Psychosocial & Spiritual Assessment  Palliative Care Outcomes  Patient/Family meeting held?  Yes  Who was at the meeting?  -- [Husband, daughter, son, daughter in law, other family]  Palliative Care Outcomes  Improved pain interventions, Improved non-pain symptom therapy, Provided end of life care assistance, Provided psychosocial or spiritual support [Provided information about bereavement support ]      Patient Active Problem List   Diagnosis Date Noted  . ICH  (intracerebral hemorrhage) ([place]) 02/16/2018  . Syncope 11/14/2017  . Hyponatremia 11/14/2017  . Orthostatic hypotension 11/14/2017  . PVC's (premature ventricular contractions) 11/13/2017  . Mitral annular calcification 12/16/2015  . Essential hypertension 12/16/2015  . Bradycardia 12/16/2015  . Post-operative state 08/13/2013    Palliative Care Assessment & Plan   Patient Profile: Shelley Cook is a 84 y.o. female with history of parathyroidism, hypertension, hyperlipidemia presenting with aphasia and R HP. CT shows large L cerebral hemorrhage with increasing mass effect. Patient remains unresponsive. Family opts for comfort measures only.   Assessment: Acute large left cerebral hemorrhage with cerebral edema Unresponsiveness Hx HTN Hx HLD  Recommendations/Plan:  Comfort measures only.   Continue prn medications to ensure comfort and dignity at EOL.   RN may pronounce.   Anticipate hospital death.   Goals of Care and Additional Recommendations:  Limitations on Scope of Treatment: Full Comfort Care  Code Status: DNR/DNI   Code Status Orders  (From admission, onward)         Start     Ordered   01/31/18 0950  Do not attempt resuscitation (DNR)  Continuous    Question Answer Comment  In the event of cardiac or respiratory ARREST Do not call a "code blue"   In the event of cardiac or respiratory ARREST Do not perform Intubation, CPR, defibrillation or ACLS   In the event of cardiac or respiratory ARREST Use medication by any route, position, wound care, and other measures to relive pain and suffering. May use oxygen, suction and manual treatment of airway obstruction as needed for comfort.      01/31/18 9518        Code Status History    Date Active Date Inactive Code Status Order ID Comments User Context   01/31/2018 0653 01/31/2018 0952 DNR [id number]  Greta Doom, MD Inpatient   01/20/2018 1815 01/31/2018 0653 Full Code [id number]  Garvin Fila, MD ED       Prognosis:   Hours - Days  Discharge Planning:  Anticipated Hospital Death  Care plan was discussed with family at bedside including daughter, son, daughter-in-law, RN  Thank you for allowing the Palliative Medicine Team to assist in the care of this patient.   Time In: 1010 Time Out: 1050 Total Time 18min Prolonged Time Billed  no      Greater than 50%  of this time was spent counseling and coordinating care related to the above assessment and plan.  Ihor Dow, FNP-C Palliative Medicine Team  Phone: [phone] Fax: [phone]  Please contact Palliative Medicine Team phone at [phone] for questions and concerns.

## 2018-02-03 NOTE — Plan of Care (Signed)
  Problem: Coping: Goal: Level of anxiety will decrease Outcome: Progressing   Problem: Pain Managment: Goal: General experience of comfort will improve Outcome: Progressing   Problem: Role Relationship: Goal: Family's ability to cope with current situation will improve Outcome: Progressing Goal: Ability to verbalize concerns, feelings, and thoughts to partner or family member will improve Outcome: Progressing   Problem: Pain Management: Goal: Satisfaction with pain management regimen will improve Outcome: [place], RN 02/03/2018

## 2018-02-03 NOTE — Progress Notes (Signed)
STROKE TEAM PROGRESS NOTE   INTERVAL HISTORY Her daughter and son are at the bedside. She is lying in bed, with mild respiratory distress, not in discomfort.  Discussed residential palliative care - family would like her to stay here. Palliative care expected hospital death.   Vitals:   03-02-18 0500 2018-03-02 2016 02/02/18 0100 02/03/18 0501  BP: (!) 161/64 (!) 158/60  (!) 142/59  Pulse: 80 82  94  Resp: 15 15  18   Temp: 97.6 F (36.4 C) (!) 101.5 F (38.6 C) 98 F (36.7 C) 99.4 F (37.4 C)  TempSrc: Axillary Axillary  Oral  SpO2: 94% 92%  (!) 87%  Weight:      Height:       Ct Angio Head W Or Wo Contrast  Result Date: 01/31/2018 CLINICAL DATA:  Follow up intracranial hemorrhage. EXAM: CT ANGIOGRAPHY HEAD AND NECK TECHNIQUE: Multidetector CT imaging of the head and neck was performed using the standard protocol during bolus administration of intravenous contrast. Multiplanar CT image reconstructions and MIPs were obtained to evaluate the vascular anatomy. Carotid stenosis measurements (when applicable) are obtained utilizing NASCET criteria, using the distal internal carotid diameter as the denominator. CONTRAST:  75 cc ISOVUE-370 IOPAMIDOL (ISOVUE-370) INJECTION 76% COMPARISON:  CT HEAD January 30, 2018 FINDINGS: CTA NECK FINDINGS: AORTIC ARCH: Normal appearance of the thoracic arch, normal branch pattern. Mild calcific atherosclerosis aortic arch. Origin of innominate and LEFT common carotid artery not included. Calcific atherosclerosis resulting in mild stenosis LEFT subclavian artery. RIGHT CAROTID SYSTEM: Common carotid artery is patent. Mild calcific atherosclerosis of the carotid bifurcation without hemodynamically significant stenosis by NASCET criteria. Less than 1 cm segment of severe stenosis mid RIGHT cervical carotid artery with poor contrast opacification, poststenotic dilatation with moderate luminal irregularity. However, streak artifact from dental amalgam limits assessment.  LEFT CAROTID SYSTEM: Common carotid artery is patent, mild calcific atherosclerosis. Mild calcific atherosclerosis of the carotid bifurcation without hemodynamically significant stenosis by NASCET criteria. Severe luminal irregularity without flow-limiting stenosis. VERTEBRAL ARTERIES:Left vertebral artery is dominant. Moderate stenosis LEFT vertebral artery origin. Moderate luminal irregularity and segmental caliber changes RIGHT vertebral artery. SKELETON: No acute osseous process though bone windows have not been submitted. Severe C5-6 spondylosis and advanced LEFT facet arthropathy. OTHER NECK: Soft tissues of the neck are nonacute though, not tailored for evaluation. UPPER CHEST: Included lung apices are clear. No superior mediastinal lymphadenopathy. CTA HEAD FINDINGS: ANTERIOR CIRCULATION: Patent cervical internal carotid arteries, petrous, cavernous and supra clinoid internal carotid arteries. Patent anterior communicating artery. Attenuated LEFT M2 segments and paucity of distal LEFT MCA branches. Patent RIGHT MCA and bilateral ACA. No  contrast extravasation or aneurysm. POSTERIOR CIRCULATION: Patent vertebral arteries, vertebrobasilar junction and basilar artery, as well as main branch vessels. Patent posterior cerebral arteries, moderate luminal irregularity compatible with atherosclerosis. No large vessel occlusion, significant stenosis, contrast extravasation or aneurysm. VENOUS SINUSES: Major dural venous sinuses are patent though not tailored for evaluation on this angiographic examination. ANATOMIC VARIANTS: None. DELAYED PHASE: Not performed. MIP images reviewed. IMPRESSION: CTA NECK: 1. Focal severe stenosis RIGHT internal carotid artery (possibly overestimated due to streak artifact), possible old dissection without pseudoaneurysm. 2. LEFT greater than RIGHT internal carotid artery fibromuscular dysplasia. 3. 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 CTA HEAD: 1. LEFT M2 occlusion with generally decreased LEFT MCA vessels, possible vasospasm or extrinsic compression from known hematoma. 2. Moderate intracranial atherosclerosis. These results will be called to the ordering clinician  or representative by the professional radiologist assistant, and communication documented in zVision Dashboard. Aortic Atherosclerosis (ICD10-I70.0). Electronically Signed   By: Elon Alas M.D.   On: 01/31/2018 06:01   Dg Abd 1 View  Result Date: 01/25/2018 CLINICAL DATA:  Unspecified constipation EXAM: ABDOMEN - 1 VIEW COMPARISON:  Abdominal CT 06/20/2017 FINDINGS: Five Sitz markers are seen within the proximal colon. Moderate stool volume. No obstruction or rectal impaction. Advanced spinal degeneration with lumbar levoscoliosis. IMPRESSION: Five Sitz markers are seen in the ascending colon. Electronically Signed   By: Monte Fantasia M.D.   On: 01/25/2018 09:19   Ct Angio Neck W And/or Wo Contrast  Result Date: 01/31/2018 CLINICAL DATA:  Follow up intracranial hemorrhage. EXAM: CT ANGIOGRAPHY HEAD AND NECK TECHNIQUE: Multidetector CT imaging of the head and neck was performed using the standard protocol during bolus administration of intravenous contrast. Multiplanar CT image reconstructions and MIPs were obtained to evaluate the vascular anatomy. Carotid stenosis measurements (when applicable) are obtained utilizing NASCET criteria, using the distal internal carotid diameter as the denominator. CONTRAST:  75 cc ISOVUE-370 IOPAMIDOL (ISOVUE-370) INJECTION 76% COMPARISON:  CT HEAD January 30, 2018 FINDINGS: CTA NECK FINDINGS: AORTIC ARCH: Normal appearance of the thoracic arch, normal branch pattern. Mild calcific atherosclerosis aortic arch. Origin of innominate and LEFT common carotid artery not included. Calcific atherosclerosis resulting in mild stenosis LEFT subclavian artery. RIGHT CAROTID SYSTEM: Common carotid  artery is patent. Mild calcific atherosclerosis of the carotid bifurcation without hemodynamically significant stenosis by NASCET criteria. Less than 1 cm segment of severe stenosis mid RIGHT cervical carotid artery with poor contrast opacification, poststenotic dilatation with moderate luminal irregularity. However, streak artifact from dental amalgam limits assessment. LEFT CAROTID SYSTEM: Common carotid artery is patent, mild calcific atherosclerosis. Mild calcific atherosclerosis of the carotid bifurcation without hemodynamically significant stenosis by NASCET criteria. Severe luminal irregularity without flow-limiting stenosis. VERTEBRAL ARTERIES:Left vertebral artery is dominant. Moderate stenosis LEFT vertebral artery origin. Moderate luminal irregularity and segmental caliber changes RIGHT vertebral artery. SKELETON: No acute osseous process though bone windows have not been submitted. Severe C5-6 spondylosis and advanced LEFT facet arthropathy. OTHER NECK: Soft tissues of the neck are nonacute though, not tailored for evaluation. UPPER CHEST: Included lung apices are clear. No superior mediastinal lymphadenopathy. CTA HEAD FINDINGS: ANTERIOR CIRCULATION: Patent cervical internal carotid arteries, petrous, cavernous and supra clinoid internal carotid arteries. Patent anterior communicating artery. Attenuated LEFT M2 segments and paucity of distal LEFT MCA branches. Patent RIGHT MCA and bilateral ACA. No  contrast extravasation or aneurysm. POSTERIOR CIRCULATION: Patent vertebral arteries, vertebrobasilar junction and basilar artery, as well as main branch vessels. Patent posterior cerebral arteries, moderate luminal irregularity compatible with atherosclerosis. No large vessel occlusion, significant stenosis, contrast extravasation or aneurysm. VENOUS SINUSES: Major dural venous sinuses are patent though not tailored for evaluation on this angiographic examination. ANATOMIC VARIANTS: None. DELAYED PHASE: Not  performed. MIP images reviewed. IMPRESSION: CTA NECK: 1. Focal severe stenosis RIGHT internal carotid artery (possibly overestimated due to streak artifact), possible old dissection without pseudoaneurysm. 2. LEFT greater than RIGHT internal carotid artery fibromuscular dysplasia. 3. 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 CTA HEAD: 1. LEFT M2 occlusion with generally decreased LEFT MCA vessels, possible vasospasm or extrinsic compression from known hematoma. 2. Moderate intracranial atherosclerosis. These results will be called to the ordering clinician or representative by the professional radiologist assistant, and communication documented in zVision Dashboard. Aortic Atherosclerosis (ICD10-I70.0). Electronically Signed   By:  Elon Alas M.D.   On: 01/31/2018 06:01   Ct Head Code Stroke Wo Contrast  Result Date: 02/12/2018 CLINICAL DATA:  Code stroke.  Slurred speech and left facial droop. EXAM: CT HEAD WITHOUT CONTRAST TECHNIQUE: Contiguous axial images were obtained from the base of the skull through the vertex without intravenous contrast. COMPARISON:  02/12/2017 FINDINGS: Brain: Primarily high-density hematoma in the posterior left cerebrum, 6.4 x 3.4 x 6.1 cm-65 cc volume. The upper portion of the hematoma is less dense than the remainder, but this is most likely due to differential clot density rather than an underlying mass. There is associated edema. No intraventricular or subarachnoid extension is seen. There is local mass effect leading to 2 mm of midline shift. Vascular: No hyperdense vessel or unexpected calcification. Skull: No traumatic finding Sinuses/Orbits: Bilateral cataract resection Other: Critical Value/emergent results were called by telephone at the time of interpretation on 01/28/2018 at 5:50 pm to Dr. Leonie Man , who is already aware ASPECTS [facility] Stroke Program Early CT Score) Not scored  in this setting IMPRESSION: Acute left cerebral hematoma measuring 65 cc. Midline shift is 2 mm. Electronically Signed   By: Monte Fantasia M.D.   On: 02/02/2018 17:52   PHYSICAL EXAM per Leonie Man Elderly Caucasian lady lying in bed. in mild respiratory distress. Afebrile. Head is nontraumatic. Neck is supple without bruit. Cardiac exam no murmur or gallop. Lungs are clear to auscultation. Distal pulses are well felt. Neurological Exam in coma, not open eyes to voice, no language outpt. Not following simple commands. No spontaneous movement in all extremities. Limited exam due to comfort care measures.    ASSESSMENT/PLAN Shelley Cook is a 84 y.o. female with history of parathyroidism, hypertension, hyperlipidemia presenting with aphasia and R HP. CT shows large L cerebral hemorrhage with increasing mass effect over night .   Stroke:  Large L cerebral hemorrhage cytotoxic cerebral edema, hemorrhage secondary to left M 2 occlusion and  ? Hemorrhagic infarct   Code Stroke CT head acute left cerebral hemorrhage 65 mL.  Midline shift 2 mm  CTA head left M2 occlusion with decreased L MCA vessels.  Possible vasospasm from known hematoma.  Intracranial atherosclerosis.  CTA neck focal severe stenosis R ICA, possible old dissection without pseudoaneurysm.  Left greater than right ICA FMD.  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  Given her age and size, will be debilitating lifelong.  Family has opted for comfort care.  She is DNR.    Continue Comfort care.    Social worker and palliative care following  Hypertension  Was treated with Cleviprex in ICU for systolic blood pressure goal less than 160.    Now in comfort  care  Hyperlipidemia  Home meds:  zocor 20  Other Stroke Risk Factors  Advanced age  Other Active Problems  Hyponatremia, [facility] day # 4  Shelley Hawking, MD PhD Stroke Neurology 03-05-2018 7:15 AM    To contact Stroke Continuity provider, please refer to [email or web address] After hours, contact General Neurology

## 2018-02-04 MED ORDER — POLYVINYL ALCOHOL 1.4 % OP SOLN
1.0000 [drp] | Freq: Four times a day (QID) | OPHTHALMIC | Status: DC
  Filled 2018-02-04: qty 15

## 2018-02-06 ENCOUNTER — Ambulatory Visit: Payer: Medicare Other | Admitting: Cardiology

## 2018-02-18 NOTE — Progress Notes (Signed)
STROKE TEAM PROGRESS NOTE   INTERVAL HISTORY Her husband, daughter and son are at the bedside. She is lying in bed, with irregular breathing today. Eyes slightly open, will put on eye drops.    Vitals:   02/01/18 2016 02/02/18 0100 02/03/18 0501 22-Feb-2018 0513  BP: (!) 158/60  (!) 142/59 (!) 98/39  Pulse: 82  94 (!) 101  Resp: 15  18 16   Temp: (!) 101.5 F (38.6 C) 98 F (36.7 C) 99.4 F (37.4 C) 98.5 F (36.9 C)  TempSrc: Axillary  Oral Oral  SpO2: 92%  (!) 87% (!) 62%  Weight:      Height:       Ct Angio Head W Or Wo Contrast  Result Date: 01/31/2018 CLINICAL DATA:  Follow up intracranial hemorrhage. EXAM: CT ANGIOGRAPHY HEAD AND NECK TECHNIQUE: Multidetector CT imaging of the head and neck was performed using the standard protocol during bolus administration of intravenous contrast. Multiplanar CT image reconstructions and MIPs were obtained to evaluate the vascular anatomy. Carotid stenosis measurements (when applicable) are obtained utilizing NASCET criteria, using the distal internal carotid diameter as the denominator. CONTRAST:  75 cc ISOVUE-370 IOPAMIDOL (ISOVUE-370) INJECTION 76% COMPARISON:  CT HEAD January 30, 2018 FINDINGS: CTA NECK FINDINGS: AORTIC ARCH: Normal appearance of the thoracic arch, normal branch pattern. Mild calcific atherosclerosis aortic arch. Origin of innominate and LEFT common carotid artery not included. Calcific atherosclerosis resulting in mild stenosis LEFT subclavian artery. RIGHT CAROTID SYSTEM: Common carotid artery is patent. Mild calcific atherosclerosis of the carotid bifurcation without hemodynamically significant stenosis by NASCET criteria. Less than 1 cm segment of severe stenosis mid RIGHT cervical carotid artery with poor contrast opacification, poststenotic dilatation with moderate luminal irregularity. However, streak artifact from dental amalgam limits assessment. LEFT CAROTID SYSTEM: Common carotid artery is patent, mild calcific  atherosclerosis. Mild calcific atherosclerosis of the carotid bifurcation without hemodynamically significant stenosis by NASCET criteria. Severe luminal irregularity without flow-limiting stenosis. VERTEBRAL ARTERIES:Left vertebral artery is dominant. Moderate stenosis LEFT vertebral artery origin. Moderate luminal irregularity and segmental caliber changes RIGHT vertebral artery. SKELETON: No acute osseous process though bone windows have not been submitted. Severe C5-6 spondylosis and advanced LEFT facet arthropathy. OTHER NECK: Soft tissues of the neck are nonacute though, not tailored for evaluation. UPPER CHEST: Included lung apices are clear. No superior mediastinal lymphadenopathy. CTA HEAD FINDINGS: ANTERIOR CIRCULATION: Patent cervical internal carotid arteries, petrous, cavernous and supra clinoid internal carotid arteries. Patent anterior communicating artery. Attenuated LEFT M2 segments and paucity of distal LEFT MCA branches. Patent RIGHT MCA and bilateral ACA. No  contrast extravasation or aneurysm. POSTERIOR CIRCULATION: Patent vertebral arteries, vertebrobasilar junction and basilar artery, as well as main branch vessels. Patent posterior cerebral arteries, moderate luminal irregularity compatible with atherosclerosis. No large vessel occlusion, significant stenosis, contrast extravasation or aneurysm. VENOUS SINUSES: Major dural venous sinuses are patent though not tailored for evaluation on this angiographic examination. ANATOMIC VARIANTS: None. DELAYED PHASE: Not performed. MIP images reviewed. IMPRESSION: CTA NECK: 1. Focal severe stenosis RIGHT internal carotid artery (possibly overestimated due to streak artifact), possible old dissection without pseudoaneurysm. 2. LEFT greater than RIGHT internal carotid artery fibromuscular dysplasia. 3. 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 CTA HEAD: 1. LEFT M2  occlusion with generally decreased LEFT MCA vessels, possible vasospasm or extrinsic compression from known hematoma. 2. Moderate intracranial atherosclerosis. These results will be called to the ordering clinician or representative by the professional radiologist assistant, and communication  documented in zVision Dashboard. Aortic Atherosclerosis (ICD10-I70.0). Electronically Signed   By: Elon Alas M.D.   On: 01/31/2018 06:01   Dg Abd 1 View  Result Date: 01/25/2018 CLINICAL DATA:  Unspecified constipation EXAM: ABDOMEN - 1 VIEW COMPARISON:  Abdominal CT 06/20/2017 FINDINGS: Five Sitz markers are seen within the proximal colon. Moderate stool volume. No obstruction or rectal impaction. Advanced spinal degeneration with lumbar levoscoliosis. IMPRESSION: Five Sitz markers are seen in the ascending colon. Electronically Signed   By: Monte Fantasia M.D.   On: 01/25/2018 09:19   Ct Angio Neck W And/or Wo Contrast  Result Date: 01/31/2018 CLINICAL DATA:  Follow up intracranial hemorrhage. EXAM: CT ANGIOGRAPHY HEAD AND NECK TECHNIQUE: Multidetector CT imaging of the head and neck was performed using the standard protocol during bolus administration of intravenous contrast. Multiplanar CT image reconstructions and MIPs were obtained to evaluate the vascular anatomy. Carotid stenosis measurements (when applicable) are obtained utilizing NASCET criteria, using the distal internal carotid diameter as the denominator. CONTRAST:  75 cc ISOVUE-370 IOPAMIDOL (ISOVUE-370) INJECTION 76% COMPARISON:  CT HEAD January 30, 2018 FINDINGS: CTA NECK FINDINGS: AORTIC ARCH: Normal appearance of the thoracic arch, normal branch pattern. Mild calcific atherosclerosis aortic arch. Origin of innominate and LEFT common carotid artery not included. Calcific atherosclerosis resulting in mild stenosis LEFT subclavian artery. RIGHT CAROTID SYSTEM: Common carotid artery is patent. Mild calcific atherosclerosis of the carotid  bifurcation without hemodynamically significant stenosis by NASCET criteria. Less than 1 cm segment of severe stenosis mid RIGHT cervical carotid artery with poor contrast opacification, poststenotic dilatation with moderate luminal irregularity. However, streak artifact from dental amalgam limits assessment. LEFT CAROTID SYSTEM: Common carotid artery is patent, mild calcific atherosclerosis. Mild calcific atherosclerosis of the carotid bifurcation without hemodynamically significant stenosis by NASCET criteria. Severe luminal irregularity without flow-limiting stenosis. VERTEBRAL ARTERIES:Left vertebral artery is dominant. Moderate stenosis LEFT vertebral artery origin. Moderate luminal irregularity and segmental caliber changes RIGHT vertebral artery. SKELETON: No acute osseous process though bone windows have not been submitted. Severe C5-6 spondylosis and advanced LEFT facet arthropathy. OTHER NECK: Soft tissues of the neck are nonacute though, not tailored for evaluation. UPPER CHEST: Included lung apices are clear. No superior mediastinal lymphadenopathy. CTA HEAD FINDINGS: ANTERIOR CIRCULATION: Patent cervical internal carotid arteries, petrous, cavernous and supra clinoid internal carotid arteries. Patent anterior communicating artery. Attenuated LEFT M2 segments and paucity of distal LEFT MCA branches. Patent RIGHT MCA and bilateral ACA. No  contrast extravasation or aneurysm. POSTERIOR CIRCULATION: Patent vertebral arteries, vertebrobasilar junction and basilar artery, as well as main branch vessels. Patent posterior cerebral arteries, moderate luminal irregularity compatible with atherosclerosis. No large vessel occlusion, significant stenosis, contrast extravasation or aneurysm. VENOUS SINUSES: Major dural venous sinuses are patent though not tailored for evaluation on this angiographic examination. ANATOMIC VARIANTS: None. DELAYED PHASE: Not performed. MIP images reviewed. IMPRESSION: CTA NECK: 1. Focal  severe stenosis RIGHT internal carotid artery (possibly overestimated due to streak artifact), possible old dissection without pseudoaneurysm. 2. LEFT greater than RIGHT internal carotid artery fibromuscular dysplasia. 3. 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 CTA HEAD: 1. LEFT M2 occlusion with generally decreased LEFT MCA vessels, possible vasospasm or extrinsic compression from known hematoma. 2. Moderate intracranial atherosclerosis. These results will be called to the ordering clinician or representative by the professional radiologist assistant, and communication documented in zVision Dashboard. Aortic Atherosclerosis (ICD10-I70.0). Electronically Signed   By: Elon Alas M.D.   On: 01/31/2018 06:01  Ct Head Code Stroke Wo Contrast  Result Date: 01/23/2018 CLINICAL DATA:  Code stroke.  Slurred speech and left facial droop. EXAM: CT HEAD WITHOUT CONTRAST TECHNIQUE: Contiguous axial images were obtained from the base of the skull through the vertex without intravenous contrast. COMPARISON:  02/12/2017 FINDINGS: Brain: Primarily high-density hematoma in the posterior left cerebrum, 6.4 x 3.4 x 6.1 cm-65 cc volume. The upper portion of the hematoma is less dense than the remainder, but this is most likely due to differential clot density rather than an underlying mass. There is associated edema. No intraventricular or subarachnoid extension is seen. There is local mass effect leading to 2 mm of midline shift. Vascular: No hyperdense vessel or unexpected calcification. Skull: No traumatic finding Sinuses/Orbits: Bilateral cataract resection Other: Critical Value/emergent results were called by telephone at the time of interpretation on 02/09/2018 at 5:50 pm to Dr. Leonie Man , who is already aware ASPECTS [facility] Stroke Program Early CT Score) Not scored in this setting IMPRESSION: Acute left cerebral hematoma  measuring 65 cc. Midline shift is 2 mm. Electronically Signed   By: Monte Fantasia M.D.   On: 02/08/2018 17:52   PHYSICAL EXAM per Leonie Man Elderly Caucasian lady lying in bed. in mild respiratory distress. Afebrile. Head is nontraumatic. Neck is supple without bruit. Cardiac exam no murmur or gallop. Lungs are clear to auscultation. Distal pulses are well felt. Neurological Exam in coma, not open eyes to voice, no language outpt. Not following simple commands. No spontaneous movement in all extremities. Limited exam due to comfort care measures.    ASSESSMENT/PLAN Shelley Cook is a 84 y.o. female with history of parathyroidism, hypertension, hyperlipidemia presenting with aphasia and R HP. CT shows large L cerebral hemorrhage with increasing mass effect over night .   Stroke:  Large L cerebral hemorrhage cytotoxic cerebral edema, hemorrhage secondary to left M 2 occlusion and  ? Hemorrhagic infarct   Code Stroke CT head acute left cerebral hemorrhage 65 mL.  Midline shift 2 mm  CTA head left M2 occlusion with decreased L MCA vessels.  Possible vasospasm from known hematoma.  Intracranial atherosclerosis.  CTA neck focal severe stenosis R ICA, possible old dissection without pseudoaneurysm.  Left greater than right ICA FMD.  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  Given her age and size, will be debilitating lifelong.  Family has opted for comfort care.  She is DNR.    Continue Comfort care.    Social worker and palliative care following  Hypertension  Was treated with Cleviprex in ICU for systolic blood pressure goal less than 160.    Now in comfort care  Hyperlipidemia  Home meds:  zocor 20  Other Stroke Risk  Factors  Advanced age  Other Active Problems  Hyponatremia, 128  Bilateral eyes open slightly - will put on artifical tears.   Hospital day # 5  Shelley Hawking, MD PhD Stroke Neurology 02-28-18 12:02 PM    To contact Stroke Continuity provider, please refer to [email or web address] After hours, contact General Neurology

## 2018-02-18 NOTE — Progress Notes (Signed)
Daily Progress Note   Patient Name: Shelley Cook       Date: 2018/02/11 DOB: 10-25-1929  Age: 84 y.o. MRN#: [id number] Attending Physician: Rosalin Hawking, MD Primary Care Physician: [name], MD Admit Date: 02/11/2018  Reason for Consultation/Follow-up: Terminal Care  Subjective: [name] is unresponsive. Breathing irregular and very shallow. No family at bedside.   Length of Stay: 5  Current Medications: Scheduled Meds:  . mouth rinse  15 mL Mouth Rinse BID    Continuous Infusions:   PRN Meds: acetaminophen, glycopyrrolate, [DISCONTINUED] haloperidol **OR** [DISCONTINUED] haloperidol **OR** haloperidol lactate, [DISCONTINUED] LORazepam **OR** [DISCONTINUED] LORazepam **OR** LORazepam, morphine  Physical Exam  Cardiovascular: Normal rate.  Pulmonary/Chest:  Irregular, shallow  Abdominal: Normal appearance.  Neurological: She is unresponsive.  Vitals reviewed.           Vital Signs: BP (!) 98/39 (BP Location: Right Arm)   Pulse (!) 101   Temp 98.5 F (36.9 C) (Oral)   Resp 16   Ht 5\' 5"  (1.651 m)   Wt 47.6 kg   SpO2 (!) 62%   BMI 17.46 kg/m  SpO2: SpO2: (!) 62 % O2 Device: O2 Device: Room Air O2 Flow Rate:    Intake/output summary:   Intake/Output Summary (Last 24 hours) at 2018/02/11 0916 Last data filed at 2018/02/11 0827 Gross per 24 hour  Intake 0 ml  Output 400 ml  Net -400 ml   LBM: Last BM Date: (pta) Baseline Weight: Weight: 49.6 kg Most recent weight: Weight: 47.6 kg       Palliative Assessment/Data: 10%    Flowsheet Rows     Most Recent Value  Intake Tab  Referral Department  Neurology  Unit at Time of Referral  Med/Surg Unit  Palliative Care Primary Diagnosis  Neurology  Date Notified  02/02/18  Palliative Care Type   New Palliative care  Reason for referral  End of Life Care Assistance  Date of Admission  02/16/2018  Date first seen by Palliative Care  02/02/18  # of days Palliative referral response time  0 Day(s)  # of days IP prior to Palliative referral  3  Clinical Assessment  Psychosocial & Spiritual Assessment  Palliative Care Outcomes  Patient/Family meeting held?  Yes  Who was at the meeting?  -- Olam Idler, daughter, son, daughter in  law, other family]  Palliative Care Outcomes  Improved pain interventions, Improved non-pain symptom therapy, Provided end of life care assistance, Provided psychosocial or spiritual support [Provided information about bereavement support ]      Patient Active Problem List   Diagnosis Date Noted  . Palliative care by specialist   . Terminal care   . Unresponsiveness   . ICH (intracerebral hemorrhage) ([place]) 02/05/2018  . Syncope 11/14/2017  . Hyponatremia 11/14/2017  . Orthostatic hypotension 11/14/2017  . PVC's (premature ventricular contractions) 11/13/2017  . Mitral annular calcification 12/16/2015  . Essential hypertension 12/16/2015  . Bradycardia 12/16/2015  . Post-operative state 08/13/2013    Palliative Care Assessment & Plan   HPI: Ms.Jaretssi T Forrestis a 84 y.o.femalewith history of parathyroidism, hypertension, hyperlipidemia presenting with aphasia and R HP. CT shows large L cerebral hemorrhage with increasing mass effect. Patient remains unresponsive. Family opts for comfort measures only.    Assessment: No family at bedside. RN reports no events overnight or this morning and that family is in and out. [name] appears overall comfortable but becoming a little tachypneic - RN asked to provide with morphine to ensure comfort. Breathing is very shallow. Anticipate hospital death. Reported to RN that I am available to her or family for any further palliative needs.   Recommendations/Plan:  Full comfort care.  Anticipate hospital death.     No changes to medications. PRNs available to ensure comfort.   Goals of Care and Additional Recommendations:  Limitations on Scope of Treatment: Full Comfort Care  Code Status:  DNR  Prognosis:   Hours - Days  Discharge Planning:  Anticipated Hospital Death  Care plan was discussed with RN  Thank you for allowing the Palliative Medicine Team to assist in the care of this patient.   Total Time 15 MIN Prolonged Time Billed  no       Greater than 50%  of this time was spent counseling and coordinating care related to the above assessment and plan.  Vinie Sill, NP Palliative Medicine Team Pager # [phone] (M-F 8a-5p) Team Phone # [phone] (Nights/Weekends)

## 2018-02-18 NOTE — Death Summary Note (Signed)
Stroke Discharge Summary  Patient ID: Shelley Cook   MRN: [id number]      DOB: 1929/04/09  Date of Admission: 01/28/2018 Date of Discharge: Feb 20, 2018  Attending Physician:  No att. providers found, Stroke MD Consultant(s):   Treatment Team:  Stroke, Md, MD Palliative Medicine Consult Patient's PCP:  Shelley Solian, MD  DISCHARGE DIAGNOSIS: Large L cerebral hemorrhage Active Problems:   ICH (intracerebral hemorrhage) ([place])   Palliative care by specialist   Terminal care   Unresponsiveness   Past Medical History:  Diagnosis Date  . Arthritis   . High cholesterol   . Hypertension   . Hypothyroidism   . Osteopenia   . Syncope and collapse   . URI, acute 08-14-2013  . Wears glasses    read   Past Surgical History:  Procedure Laterality Date  . ABDOMINAL HYSTERECTOMY    . BACK SURGERY  2009   lumb lam  . DILATION AND CURETTAGE OF UTERUS    . EYE SURGERY     both cataracts 2013  . fractured femure  1988  . HERNIA REPAIR  2008   left ingunial  . hip replacement left replacement 2009  2009   left total hip  . left heel    . MASS EXCISION N/A 07/18/2013   Procedure: EXCISION SEBACEOUS CYST SCALP;  Surgeon: [name]. Cornett, MD;  Location: [facility];  Service: General;  Laterality: N/A;  . TONSILLECTOMY    . TUBAL LIGATION        LABORATORY STUDIES CBC    Component Value Date/Time   WBC 7.7 01/23/2018 1716   RBC 4.41 01/25/2018 1716   HGB 15.6 (H) 02/01/2018 1728   HCT 46.0 02/13/2018 1728   PLT 269 02/06/2018 1716   MCV 93.4 01/27/2018 1716   MCH 32.7 01/29/2018 1716   MCHC 35.0 01/19/2018 1716   RDW 11.7 02/10/2018 1716   LYMPHSABS 1.4 01/31/2018 1716   MONOABS 0.9 02/17/2018 1716   EOSABS 0.2 02/15/2018 1716   BASOSABS 0.1 02/09/2018 1716   CMP    Component Value Date/Time   NA 127 (L) 01/31/2018 0637   NA 122 (L) 12/06/2017 1410   K 5.2 (H) 01/19/2018 1728   CL 93 (L) 01/22/2018 1728   CO2 22 02/17/2018 1716   GLUCOSE  128 (H) 01/29/2018 1728   BUN 14 02/03/2018 1728   BUN 12 12/06/2017 1410   CREATININE 0.70 01/24/2018 1728   CALCIUM 8.9 02/03/2018 1716   PROT 6.5 02/11/2018 1716   ALBUMIN 3.9 01/26/2018 1716   AST 41 01/29/2018 1716   ALT 12 02/10/2018 1716   ALKPHOS 64 02/03/2018 1716   BILITOT 1.3 (H) 01/22/2018 1716   GFRNONAA >60 01/26/2018 1716   GFRAA >60 02/16/2018 1716   COAGS Lab Results  Component Value Date   INR 1.28 02/12/2018   INR 1.1 09/20/2007   INR 1.1 06/12/2007   Lipid PanelNo results found for: CHOL, TRIG, HDL, CHOLHDL, VLDL, LDLCALC HgbA1C No results found for: HGBA1C Urinalysis    Component Value Date/Time   COLORURINE COLORLESS (A) 10/26/2017 [place] 10/26/2017 1338   LABSPEC 1.002 (L) 10/26/2017 1338   PHURINE 8.0 10/26/2017 1338   GLUCOSEU NEGATIVE 10/26/2017 1338   HGBUR MODERATE (A) 10/26/2017 1338   BILIRUBINUR NEGATIVE 10/26/2017 1338   KETONESUR 5 (A) 10/26/2017 1338   PROTEINUR NEGATIVE 10/26/2017 1338   UROBILINOGEN 0.2 09/20/2007 1045   NITRITE NEGATIVE 10/26/2017 1338   LEUKOCYTESUR  NEGATIVE 10/26/2017 1338   Urine Drug Screen No results found for: LABOPIA, COCAINSCRNUR, LABBENZ, AMPHETMU, THCU, LABBARB  Alcohol Level No results found for: [facility]   SIGNIFICANT DIAGNOSTIC STUDIES  Ct Angio Head W Or Wo Contrast  Result Date: 01/31/2018 CLINICAL DATA:  Follow up intracranial hemorrhage. EXAM: CT ANGIOGRAPHY HEAD AND NECK TECHNIQUE: Multidetector CT imaging of the head and neck was performed using the standard protocol during bolus administration of intravenous contrast. Multiplanar CT image reconstructions and MIPs were obtained to evaluate the vascular anatomy. Carotid stenosis measurements (when applicable) are obtained utilizing NASCET criteria, using the distal internal carotid diameter as the denominator. CONTRAST:  75 cc ISOVUE-370 IOPAMIDOL (ISOVUE-370) INJECTION 76% COMPARISON:  CT HEAD January 30, 2018 FINDINGS: CTA NECK  FINDINGS: AORTIC ARCH: Normal appearance of the thoracic arch, normal branch pattern. Mild calcific atherosclerosis aortic arch. Origin of innominate and LEFT common carotid artery not included. Calcific atherosclerosis resulting in mild stenosis LEFT subclavian artery. RIGHT CAROTID SYSTEM: Common carotid artery is patent. Mild calcific atherosclerosis of the carotid bifurcation without hemodynamically significant stenosis by NASCET criteria. Less than 1 cm segment of severe stenosis mid RIGHT cervical carotid artery with poor contrast opacification, poststenotic dilatation with moderate luminal irregularity. However, streak artifact from dental amalgam limits assessment. LEFT CAROTID SYSTEM: Common carotid artery is patent, mild calcific atherosclerosis. Mild calcific atherosclerosis of the carotid bifurcation without hemodynamically significant stenosis by NASCET criteria. Severe luminal irregularity without flow-limiting stenosis. VERTEBRAL ARTERIES:Left vertebral artery is dominant. Moderate stenosis LEFT vertebral artery origin. Moderate luminal irregularity and segmental caliber changes RIGHT vertebral artery. SKELETON: No acute osseous process though bone windows have not been submitted. Severe C5-6 spondylosis and advanced LEFT facet arthropathy. OTHER NECK: Soft tissues of the neck are nonacute though, not tailored for evaluation. UPPER CHEST: Included lung apices are clear. No superior mediastinal lymphadenopathy. CTA HEAD FINDINGS: ANTERIOR CIRCULATION: Patent cervical internal carotid arteries, petrous, cavernous and supra clinoid internal carotid arteries. Patent anterior communicating artery. Attenuated LEFT M2 segments and paucity of distal LEFT MCA branches. Patent RIGHT MCA and bilateral ACA. No  contrast extravasation or aneurysm. POSTERIOR CIRCULATION: Patent vertebral arteries, vertebrobasilar junction and basilar artery, as well as main branch vessels. Patent posterior cerebral arteries,  moderate luminal irregularity compatible with atherosclerosis. No large vessel occlusion, significant stenosis, contrast extravasation or aneurysm. VENOUS SINUSES: Major dural venous sinuses are patent though not tailored for evaluation on this angiographic examination. ANATOMIC VARIANTS: None. DELAYED PHASE: Not performed. MIP images reviewed. IMPRESSION: CTA NECK: 1. Focal severe stenosis RIGHT internal carotid artery (possibly overestimated due to streak artifact), possible old dissection without pseudoaneurysm. 2. LEFT greater than RIGHT internal carotid artery fibromuscular dysplasia. 3. 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 CTA HEAD: 1. LEFT M2 occlusion with generally decreased LEFT MCA vessels, possible vasospasm or extrinsic compression from known hematoma. 2. Moderate intracranial atherosclerosis. These results will be called to the ordering clinician or representative by the professional radiologist assistant, and communication documented in zVision Dashboard. Aortic Atherosclerosis (ICD10-I70.0). Electronically Signed   By: Elon Alas M.D.   On: 01/31/2018 06:01   Dg Abd 1 View  Result Date: 01/25/2018 CLINICAL DATA:  Unspecified constipation EXAM: ABDOMEN - 1 VIEW COMPARISON:  Abdominal CT 06/20/2017 FINDINGS: Five Sitz markers are seen within the proximal colon. Moderate stool volume. No obstruction or rectal impaction. Advanced spinal degeneration with lumbar levoscoliosis. IMPRESSION: Five Sitz markers are seen in the ascending colon. Electronically Signed   By:  Monte Fantasia M.D.   On: 01/25/2018 09:19   Ct Angio Neck W And/or Wo Contrast  Result Date: 01/31/2018 CLINICAL DATA:  Follow up intracranial hemorrhage. EXAM: CT ANGIOGRAPHY HEAD AND NECK TECHNIQUE: Multidetector CT imaging of the head and neck was performed using the standard protocol during bolus administration of intravenous  contrast. Multiplanar CT image reconstructions and MIPs were obtained to evaluate the vascular anatomy. Carotid stenosis measurements (when applicable) are obtained utilizing NASCET criteria, using the distal internal carotid diameter as the denominator. CONTRAST:  75 cc ISOVUE-370 IOPAMIDOL (ISOVUE-370) INJECTION 76% COMPARISON:  CT HEAD January 30, 2018 FINDINGS: CTA NECK FINDINGS: AORTIC ARCH: Normal appearance of the thoracic arch, normal branch pattern. Mild calcific atherosclerosis aortic arch. Origin of innominate and LEFT common carotid artery not included. Calcific atherosclerosis resulting in mild stenosis LEFT subclavian artery. RIGHT CAROTID SYSTEM: Common carotid artery is patent. Mild calcific atherosclerosis of the carotid bifurcation without hemodynamically significant stenosis by NASCET criteria. Less than 1 cm segment of severe stenosis mid RIGHT cervical carotid artery with poor contrast opacification, poststenotic dilatation with moderate luminal irregularity. However, streak artifact from dental amalgam limits assessment. LEFT CAROTID SYSTEM: Common carotid artery is patent, mild calcific atherosclerosis. Mild calcific atherosclerosis of the carotid bifurcation without hemodynamically significant stenosis by NASCET criteria. Severe luminal irregularity without flow-limiting stenosis. VERTEBRAL ARTERIES:Left vertebral artery is dominant. Moderate stenosis LEFT vertebral artery origin. Moderate luminal irregularity and segmental caliber changes RIGHT vertebral artery. SKELETON: No acute osseous process though bone windows have not been submitted. Severe C5-6 spondylosis and advanced LEFT facet arthropathy. OTHER NECK: Soft tissues of the neck are nonacute though, not tailored for evaluation. UPPER CHEST: Included lung apices are clear. No superior mediastinal lymphadenopathy. CTA HEAD FINDINGS: ANTERIOR CIRCULATION: Patent cervical internal carotid arteries, petrous, cavernous and supra clinoid  internal carotid arteries. Patent anterior communicating artery. Attenuated LEFT M2 segments and paucity of distal LEFT MCA branches. Patent RIGHT MCA and bilateral ACA. No  contrast extravasation or aneurysm. POSTERIOR CIRCULATION: Patent vertebral arteries, vertebrobasilar junction and basilar artery, as well as main branch vessels. Patent posterior cerebral arteries, moderate luminal irregularity compatible with atherosclerosis. No large vessel occlusion, significant stenosis, contrast extravasation or aneurysm. VENOUS SINUSES: Major dural venous sinuses are patent though not tailored for evaluation on this angiographic examination. ANATOMIC VARIANTS: None. DELAYED PHASE: Not performed. MIP images reviewed. IMPRESSION: CTA NECK: 1. Focal severe stenosis RIGHT internal carotid artery (possibly overestimated due to streak artifact), possible old dissection without pseudoaneurysm. 2. LEFT greater than RIGHT internal carotid artery fibromuscular dysplasia. 3. 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 CTA HEAD: 1. LEFT M2 occlusion with generally decreased LEFT MCA vessels, possible vasospasm or extrinsic compression from known hematoma. 2. Moderate intracranial atherosclerosis. These results will be called to the ordering clinician or representative by the professional radiologist assistant, and communication documented in zVision Dashboard. Aortic Atherosclerosis (ICD10-I70.0). Electronically Signed   By: Elon Alas M.D.   On: 01/31/2018 06:01   Ct Head Code Stroke Wo Contrast  Result Date: 02/10/2018 CLINICAL DATA:  Code stroke.  Slurred speech and left facial droop. EXAM: CT HEAD WITHOUT CONTRAST TECHNIQUE: Contiguous axial images were obtained from the base of the skull through the vertex without intravenous contrast. COMPARISON:  02/12/2017 FINDINGS: Brain: Primarily high-density hematoma in the posterior left  cerebrum, 6.4 x 3.4 x 6.1 cm-65 cc volume. The upper portion of the hematoma is less dense than the remainder, but this is most  likely due to differential clot density rather than an underlying mass. There is associated edema. No intraventricular or subarachnoid extension is seen. There is local mass effect leading to 2 mm of midline shift. Vascular: No hyperdense vessel or unexpected calcification. Skull: No traumatic finding Sinuses/Orbits: Bilateral cataract resection Other: Critical Value/emergent results were called by telephone at the time of interpretation on 01/25/2018 at 5:50 pm to Dr. Leonie Man , who is already aware ASPECTS [facility] Stroke Program Early CT Score) Not scored in this setting IMPRESSION: Acute left cerebral hematoma measuring 65 cc. Midline shift is 2 mm. Electronically Signed   By: Monte Fantasia M.D.   On: 01/29/2018 17:52      HISTORY OF PRESENT ILLNESS (From Dr Clydene Fake H&P on 02/14/2018) Shelley Blake Forrestis a 84 y.o.femalewith a past medical history of hypothyroid, hypertension, hyperlipidemia, who presents today as a code stroke. Her last seen normal was at 1620 by her caregiver who noticed that she was acting abnormal at 1630. Patient is unable to contribute to history. EMS reports that patient is ambulatory and fully functional at baseline.I subsequently confirm this with the patient's daughter who stated that she had last spoken to the patient at 4 PM and she was fine. Patient does have history of hypertension and apparently takes her medication regularly and blood pressure is not poorly controlled. However EMS describe the bed pressure to be about 200 and was found to be 190/110 (. She was taken for a stat CT scan of the head which showed a large  6.4 x 3.4 x 6.1 cm -65 cubic cc volumeleft parieto-occipital parenchymal hemorrhage.there is no associated edema intraventricular and subarachnoid extension.  LSN: 4:20 PM on 02/08/2018 tPA not Given: due to hemorrhage ICH score  3  HOSPITAL COURSE Shelley Cook is a 84 y.o. female with history of parathyroidism, hypertension, hyperlipidemia presenting with aphasia and R HP. CT shows large L cerebral hemorrhage with increasing mass effect over night .   Stroke:  Large L cerebral hemorrhage cytotoxic cerebral edema, hemorrhage secondary to left M 2 occlusion and  ? Hemorrhagic infarct   Code Stroke CT head acute left cerebral hemorrhage 65 mL.  Midline shift 2 mm  CTA head left M2 occlusion with decreased L MCA vessels.  Possible vasospasm from known hematoma.  Intracranial atherosclerosis.  CTA neck focal severe stenosis R ICA, possible old dissection without pseudoaneurysm.  Left greater than right ICA FMD.  Luminal irregularity RVA with old non-flow-limiting dissection, FMD or atherosclerosis  SCDs for VTE prophylaxis  on 3% for induced hypernatremia to prevent cerebral edema  NPO  aspirin 81 mg daily prior to admission, now on No antithrombotic given hemorrhage  Patient stroke is large.  Given her age and size, will be debilitating lifelong.  Family has opted for comfort care.  She is already DNR.  Will transition to comfort care.  Orders for test discontinued and occasions for comfort ordered.  Transfer to floor, palliative care   Social worker consulted for disposition to residential hospice should she survive the next 24 hours   Hypertension  Currently on Cleviprex for systolic blood pressure goal less than 160.  Will discontinue   Hyperlipidemia  Home meds:  zocor 20  Other Stroke Risk Factors  Advanced age  Other Active Problems  Hyponatremia on arrival, 70   DISCHARGE EXAM Blood pressure (!) 98/39, pulse (!) 101, temperature 98.5 F (36.9 C), temperature source Oral, resp. rate 16, height 5\' 5"  (1.651 m), weight 47.6 kg, SpO2 [name])  62 %. Elderly Caucasian lady lying in bed. in mild respiratory distress. Afebrile. Head is nontraumatic. Neck is supple without bruit. Cardiac exam  no murmur or gallop. Lungs are clear to auscultation. Distal pulses are well felt. Neurological Exam in coma, not open eyes to voice, no language outpt. Not following simple commands. No spontaneous movement in all extremities. Limited exam due to comfort care measures.   Dr Clydene Fake note 01/31/2018  "I had a long discussion with the patient's daughter, . granddaughter and husband about her poor prognosis and chances of survival and making meaningful recovery"  "Family appears quite clear that patient would not want to live a life of disability or have a feeding tube and nursing home care which appears unavoidable. They've decided to make the patient comfort care only. We'll transfer the patient to the hospice unit and to hospice nursing home later if necessary".  Nursing note February 20, 2018 Family of patient called nurse into room on 02/20/18 at 12:41 PM.  Patient found to have no respirations and no heart beat.  TOD 1236.  Verified by Samantha Crimes, RN and BB Mellody Dance, RN.     30 minutes were spent preparing discharge.  Mikey Bussing PA-C [facility] Pager [phone] 02/20/18, 2:55 PM

## 2018-02-18 NOTE — Progress Notes (Signed)
Family of patient called nurse into room.  Patient found to have no respirations and no heart beat.  TOD 1236.  Verified by Samantha Crimes, RN and BB Mellody Dance, RN.  Will complete postmortem care and check list

## 2018-02-18 DEATH — deceased

## 2018-02-19 ENCOUNTER — Ambulatory Visit: Payer: Medicare Other | Admitting: Gastroenterology

## 2019-10-30 IMAGING — CT CT ANGIO NECK
2 of 7 series · 8 of 33 positions shown · IV contrast (OMNI 350)
Comparison: CT HEAD January 30, 2018

CLINICAL DATA: Follow up intracranial hemorrhage.

EXAM:
CT ANGIOGRAPHY HEAD AND NECK
TECHNIQUE: Multidetector CT imaging of the head and neck was performed using
the standard protocol during bolus administration of intravenous
contrast. Multiplanar CT image reconstructions and MIPs were
obtained to evaluate the vascular anatomy. Carotid stenosis
measurements (when applicable) are obtained utilizing NASCET
criteria, using the distal internal carotid diameter as the
denominator.
CONTRAST:  75 cc [id number] IOPAMIDOL ([id number]) INJECTION 76%

[Series 5: cta neck · axial · 0.43mm/px · z∈[-206,-90]mm · 2 of 154 slices shown]
[im 52/154  soft-tissue]
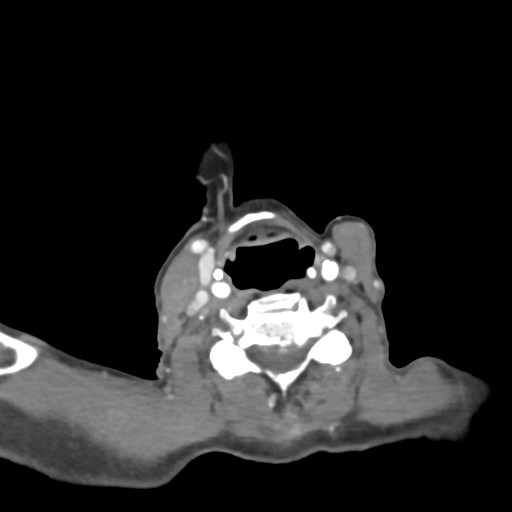
[im 103/154  soft-tissue]
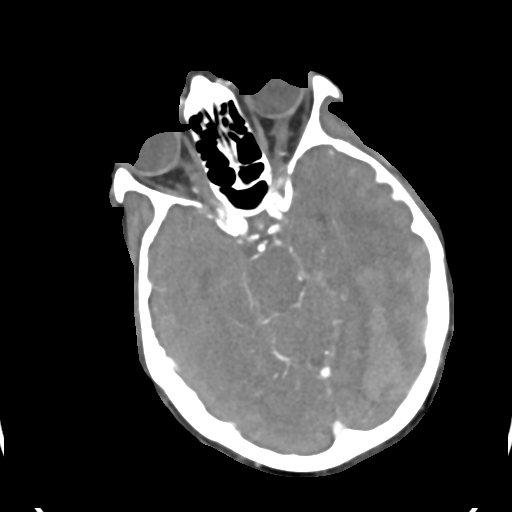

[Series 7: cta neck axial · axial · 0.39mm/px · z∈[-280,-55]mm · 6 of 320 slices shown]
[im 46/320  soft-tissue]
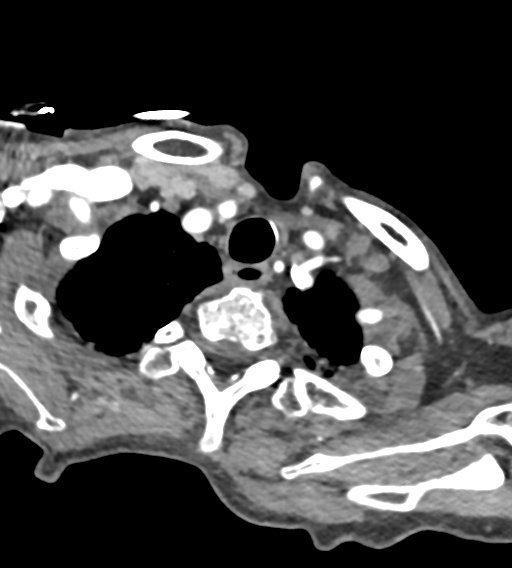
[im 92/320  bone]
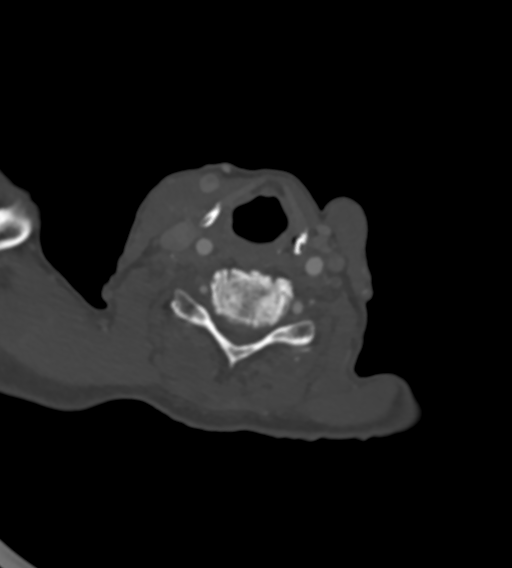
[im 137/320  soft-tissue]
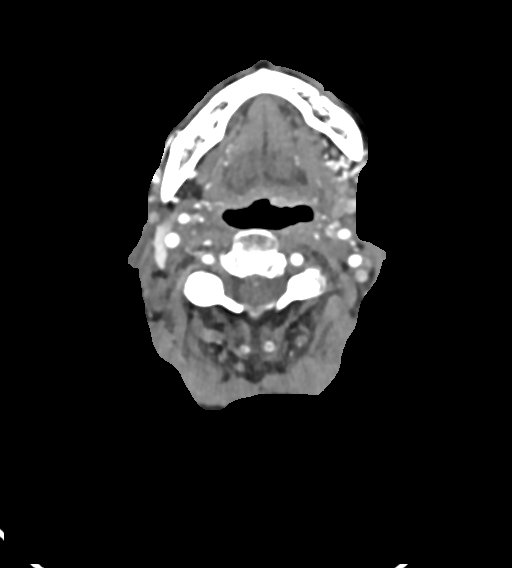
[im 183/320  bone]
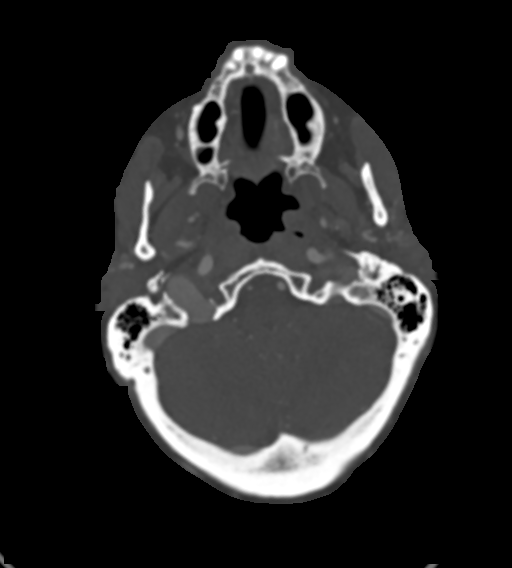
[im 228/320  soft-tissue]
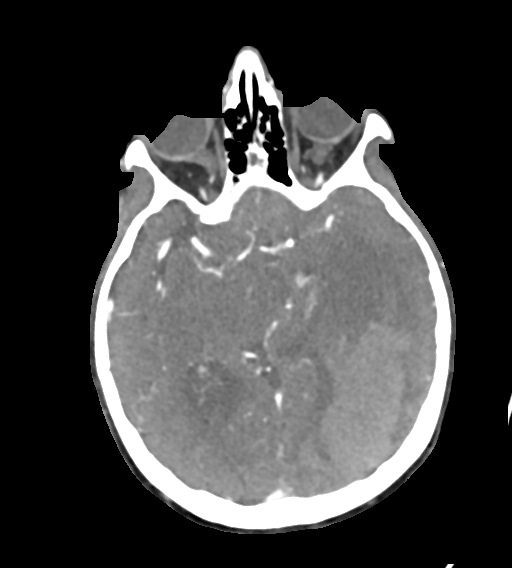
[im 274/320  bone]
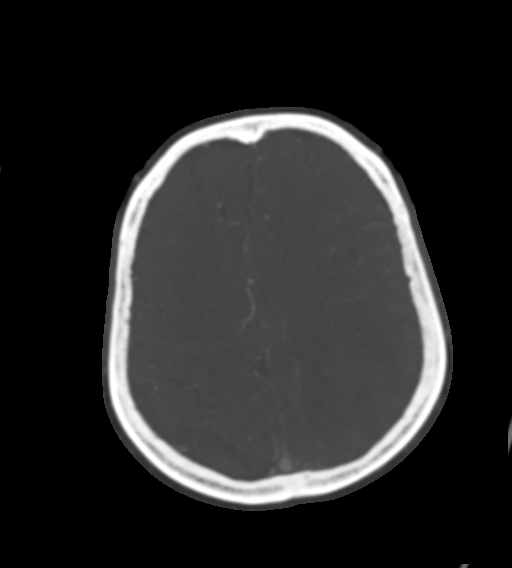

[8 of 33 positions shown; findings below may reference images not displayed]

FINDINGS: CTA NECK FINDINGS:

AORTIC ARCH: Normal appearance of the thoracic arch, normal branch
pattern. Mild calcific atherosclerosis aortic arch. Origin of
innominate and LEFT common carotid artery not included. Calcific
atherosclerosis resulting in mild stenosis LEFT subclavian artery.

RIGHT CAROTID SYSTEM: Common carotid artery is patent. Mild calcific
atherosclerosis of the carotid bifurcation without hemodynamically
significant stenosis by NASCET criteria. Less than 1 cm segment of
severe stenosis mid RIGHT cervical carotid artery with poor contrast
opacification, poststenotic dilatation with moderate luminal
irregularity. However, streak artifact from dental amalgam limits
assessment.

LEFT CAROTID SYSTEM: Common carotid artery is patent, mild calcific
atherosclerosis. Mild calcific atherosclerosis of the carotid
bifurcation without hemodynamically significant stenosis by NASCET
criteria. Severe luminal irregularity without flow-limiting
stenosis.

VERTEBRAL ARTERIES:Left vertebral artery is dominant. Moderate
stenosis LEFT vertebral artery origin. Moderate luminal irregularity
and segmental caliber changes RIGHT vertebral artery.

SKELETON: No acute osseous process though bone windows have not been
submitted. Severe C5-6 spondylosis and advanced LEFT facet
arthropathy.

OTHER NECK: Soft tissues of the neck are nonacute though, not
tailored for evaluation.

UPPER CHEST: Included lung apices are clear. No superior mediastinal
lymphadenopathy.

CTA HEAD FINDINGS:

ANTERIOR CIRCULATION: Patent cervical internal carotid arteries,
petrous, cavernous and supra clinoid internal carotid arteries.
Patent anterior communicating artery. Attenuated LEFT M2 segments
and paucity of distal LEFT MCA branches. Patent RIGHT MCA and
bilateral ACA.

No  contrast extravasation or aneurysm.

POSTERIOR CIRCULATION: Patent vertebral arteries, vertebrobasilar
junction and basilar artery, as well as main branch vessels. Patent
posterior cerebral arteries, moderate luminal irregularity
compatible with atherosclerosis.

No large vessel occlusion, significant stenosis, contrast
extravasation or aneurysm.

VENOUS SINUSES: Major dural venous sinuses are patent though not
tailored for evaluation on this angiographic examination.

ANATOMIC VARIANTS: None.

DELAYED PHASE: Not performed.

MIP images reviewed.
IMPRESSION: CTA NECK:

1. Focal severe stenosis RIGHT internal carotid artery (possibly
overestimated due to streak artifact), possible old dissection
without pseudoaneurysm.
2. LEFT greater than RIGHT internal carotid artery fibromuscular
dysplasia.
3. Luminal irregularity RIGHT vertebral artery seen with old non
flow-limiting dissection, fibromuscular dysplasia or
atherosclerosis.

CTA HEAD:

1. LEFT M2 occlusion with generally decreased LEFT MCA vessels,
possible vasospasm or extrinsic compression from known hematoma.
2. Moderate intracranial atherosclerosis.

These results will be called to the ordering clinician or
representative by the professional radiologist assistant, and
communication documented in [employer].

Aortic Atherosclerosis ([id number]).
